# Patient Record
Sex: Male | Born: 1960
Health system: Southern US, Community
[De-identification: ages and names within clinical notes are randomized; demographics above are authoritative.]

## PROBLEM LIST (undated history)

## (undated) DIAGNOSIS — K219 Gastro-esophageal reflux disease without esophagitis: Secondary | ICD-10-CM

## (undated) DIAGNOSIS — J342 Deviated nasal septum: Secondary | ICD-10-CM

## (undated) DIAGNOSIS — H401410 Capsular glaucoma with pseudoexfoliation of lens, right eye, stage unspecified: Secondary | ICD-10-CM

## (undated) DIAGNOSIS — H7290 Unspecified perforation of tympanic membrane, unspecified ear: Secondary | ICD-10-CM

## (undated) HISTORY — PX: ESOPHAGOGASTRODUODENOSCOPY: SHX1529

## (undated) HISTORY — DX: Unspecified perforation of tympanic membrane, unspecified ear: H72.90

## (undated) HISTORY — DX: Deviated nasal septum: J34.2

## (undated) HISTORY — PX: FACIAL RECONSTRUCTION SURGERY: SHX631

## (undated) HISTORY — DX: Capsular glaucoma with pseudoexfoliation of lens, right eye, stage unspecified: H40.1410

## (undated) HISTORY — PX: MIDDLE EAR SURGERY: SHX713

---

## 2005-08-08 ENCOUNTER — Emergency Department (HOSPITAL_COMMUNITY): Admission: EM | Admit: 2005-08-08 | Discharge: 2005-08-08 | Payer: Self-pay | Admitting: Emergency Medicine

## 2006-10-13 HISTORY — PX: KNEE SURGERY: SHX244

## 2012-08-17 ENCOUNTER — Other Ambulatory Visit: Payer: Self-pay | Admitting: Orthopedic Surgery

## 2012-08-17 DIAGNOSIS — M25561 Pain in right knee: Secondary | ICD-10-CM

## 2012-08-21 ENCOUNTER — Ambulatory Visit
Admission: RE | Admit: 2012-08-21 | Discharge: 2012-08-21 | Disposition: A | Discharge status: Home / Self Care | Payer: BC Managed Care – PPO | Source: Ambulatory Visit | Attending: Orthopedic Surgery | Admitting: Orthopedic Surgery

## 2012-08-21 DIAGNOSIS — M25561 Pain in right knee: Secondary | ICD-10-CM

## 2014-10-13 HISTORY — PX: KNEE CARTILAGE SURGERY: SHX688

## 2015-10-14 HISTORY — PX: KNEE CARTILAGE SURGERY: SHX688

## 2015-10-31 ENCOUNTER — Other Ambulatory Visit (HOSPITAL_COMMUNITY): Payer: Self-pay | Admitting: Sports Medicine

## 2015-10-31 DIAGNOSIS — G8928 Other chronic postprocedural pain: Secondary | ICD-10-CM

## 2015-10-31 DIAGNOSIS — M2242 Chondromalacia patellae, left knee: Secondary | ICD-10-CM

## 2015-10-31 DIAGNOSIS — S83232S Complex tear of medial meniscus, current injury, left knee, sequela: Secondary | ICD-10-CM

## 2015-11-09 ENCOUNTER — Encounter (HOSPITAL_COMMUNITY)
Admission: RE | Admit: 2015-11-09 | Discharge: 2015-11-09 | Disposition: A | Discharge status: Home / Self Care | Payer: Worker's Compensation | Source: Ambulatory Visit | Attending: Sports Medicine | Admitting: Sports Medicine

## 2015-11-09 ENCOUNTER — Ambulatory Visit (HOSPITAL_COMMUNITY)
Admission: RE | Admit: 2015-11-09 | Discharge: 2015-11-09 | Disposition: A | Discharge status: Home / Self Care | Payer: Worker's Compensation | Source: Ambulatory Visit | Attending: Sports Medicine | Admitting: Sports Medicine

## 2015-11-09 DIAGNOSIS — M25562 Pain in left knee: Secondary | ICD-10-CM | POA: Insufficient documentation

## 2015-11-09 DIAGNOSIS — G8928 Other chronic postprocedural pain: Secondary | ICD-10-CM

## 2015-11-09 DIAGNOSIS — M2242 Chondromalacia patellae, left knee: Secondary | ICD-10-CM

## 2015-11-09 DIAGNOSIS — X58XXXS Exposure to other specified factors, sequela: Secondary | ICD-10-CM | POA: Insufficient documentation

## 2015-11-09 DIAGNOSIS — S83232S Complex tear of medial meniscus, current injury, left knee, sequela: Secondary | ICD-10-CM | POA: Insufficient documentation

## 2015-11-09 MED ORDER — TECHNETIUM TC 99M MEDRONATE IV KIT
25.0000 | PACK | Freq: Once | INTRAVENOUS | Status: AC | PRN
  Administered 2015-11-09: 25 via INTRAVENOUS

## 2016-01-29 ENCOUNTER — Encounter (HOSPITAL_COMMUNITY): Payer: Self-pay | Admitting: Physician Assistant

## 2016-01-29 ENCOUNTER — Emergency Department (HOSPITAL_COMMUNITY): Payer: BLUE CROSS/BLUE SHIELD

## 2016-01-29 ENCOUNTER — Observation Stay (HOSPITAL_COMMUNITY)
Admission: EM | Admit: 2016-01-29 | Discharge: 2016-01-30 | Disposition: A | Discharge status: Home / Self Care | Payer: BLUE CROSS/BLUE SHIELD | Attending: Interventional Cardiology | Admitting: Interventional Cardiology

## 2016-01-29 DIAGNOSIS — I2510 Atherosclerotic heart disease of native coronary artery without angina pectoris: Secondary | ICD-10-CM | POA: Diagnosis not present

## 2016-01-29 DIAGNOSIS — Z8249 Family history of ischemic heart disease and other diseases of the circulatory system: Secondary | ICD-10-CM | POA: Insufficient documentation

## 2016-01-29 DIAGNOSIS — E785 Hyperlipidemia, unspecified: Secondary | ICD-10-CM | POA: Diagnosis not present

## 2016-01-29 DIAGNOSIS — I4510 Unspecified right bundle-branch block: Secondary | ICD-10-CM | POA: Insufficient documentation

## 2016-01-29 DIAGNOSIS — F419 Anxiety disorder, unspecified: Secondary | ICD-10-CM | POA: Diagnosis not present

## 2016-01-29 DIAGNOSIS — R072 Precordial pain: Secondary | ICD-10-CM | POA: Diagnosis not present

## 2016-01-29 DIAGNOSIS — I25110 Atherosclerotic heart disease of native coronary artery with unstable angina pectoris: Secondary | ICD-10-CM | POA: Diagnosis not present

## 2016-01-29 DIAGNOSIS — R079 Chest pain, unspecified: Secondary | ICD-10-CM | POA: Diagnosis not present

## 2016-01-29 DIAGNOSIS — K219 Gastro-esophageal reflux disease without esophagitis: Secondary | ICD-10-CM | POA: Diagnosis present

## 2016-01-29 DIAGNOSIS — E669 Obesity, unspecified: Secondary | ICD-10-CM

## 2016-01-29 HISTORY — DX: Gastro-esophageal reflux disease without esophagitis: K21.9

## 2016-01-29 LAB — LIPID PANEL
CHOL/HDL RATIO: 5.9 ratio
Cholesterol: 223 mg/dL — ABNORMAL HIGH (ref 0–200)
HDL: 38 mg/dL — AB (ref >40)
LDL CALC: 156 mg/dL — AB (ref 0–99)
TRIGLYCERIDES: 147 mg/dL (ref <150)
VLDL: 29 mg/dL (ref 0–40)

## 2016-01-29 LAB — BASIC METABOLIC PANEL
ANION GAP: 11 (ref 5–15)
BUN: 10 mg/dL (ref 6–20)
CO2: 23 mmol/L (ref 22–32)
Calcium: 9.2 mg/dL (ref 8.9–10.3)
Chloride: 105 mmol/L (ref 101–111)
Creatinine, Ser: 0.91 mg/dL (ref 0.61–1.24)
GFR calc Af Amer: >60 mL/min (ref >60)
GFR calc non Af Amer: >60 mL/min (ref >60)
GLUCOSE: 120 mg/dL — AB (ref 65–99)
POTASSIUM: 4 mmol/L (ref 3.5–5.1)
Sodium: 139 mmol/L (ref 135–145)

## 2016-01-29 LAB — TSH: TSH: 1.499 u[IU]/mL (ref 0.350–4.500)

## 2016-01-29 LAB — I-STAT TROPONIN, ED: Troponin i, poc: 0 ng/mL (ref 0.00–0.08)

## 2016-01-29 LAB — CBC
HEMATOCRIT: 39.3 % (ref 39.0–52.0)
HEMOGLOBIN: 13.1 g/dL (ref 13.0–17.0)
MCH: 27.1 pg (ref 26.0–34.0)
MCHC: 33.3 g/dL (ref 30.0–36.0)
MCV: 81.4 fL (ref 78.0–100.0)
Platelets: 205 10*3/uL (ref 150–400)
RBC: 4.83 MIL/uL (ref 4.22–5.81)
RDW: 13.1 % (ref 11.5–15.5)
WBC: 3.5 10*3/uL — ABNORMAL LOW (ref 4.0–10.5)

## 2016-01-29 LAB — PROTIME-INR
INR: 0.97 (ref 0.00–1.49)
Prothrombin Time: 13.1 seconds (ref 11.6–15.2)

## 2016-01-29 LAB — TROPONIN I: Troponin I: <0.03 ng/mL (ref <0.031)

## 2016-01-29 LAB — APTT: APTT: 28 s (ref 24–37)

## 2016-01-29 MED ORDER — ASPIRIN EC 81 MG PO TBEC: 81.0000 mg | DELAYED_RELEASE_TABLET | Freq: Every day | ORAL | Status: DC

## 2016-01-29 MED ORDER — SODIUM CHLORIDE 0.9 % IV SOLN: 250.0000 mL | INTRAVENOUS | Status: DC | PRN

## 2016-01-29 MED ORDER — MORPHINE SULFATE (PF) 2 MG/ML IV SOLN
2.0000 mg | INTRAVENOUS | Status: DC | PRN
  Administered 2016-01-29 (×2): 2 mg via INTRAVENOUS
  Filled 2016-01-29 (×2): qty 1

## 2016-01-29 MED ORDER — NITROGLYCERIN 2 % TD OINT
1.0000 [in_us] | TOPICAL_OINTMENT | Freq: Three times a day (TID) | TRANSDERMAL | Status: DC
  Administered 2016-01-29: 1 [in_us] via TOPICAL
  Filled 2016-01-29 (×2): qty 1

## 2016-01-29 MED ORDER — ACETAMINOPHEN 325 MG PO TABS: 650.0000 mg | ORAL_TABLET | ORAL | Status: DC | PRN

## 2016-01-29 MED ORDER — ATORVASTATIN CALCIUM 40 MG PO TABS
40.0000 mg | ORAL_TABLET | Freq: Every day | ORAL | Status: DC
  Administered 2016-01-30: 40 mg via ORAL
  Filled 2016-01-29: qty 1

## 2016-01-29 MED ORDER — NITROGLYCERIN 0.4 MG SL SUBL: 0.4000 mg | SUBLINGUAL_TABLET | SUBLINGUAL | Status: DC | PRN

## 2016-01-29 MED ORDER — LORATADINE 10 MG PO TABS
10.0000 mg | ORAL_TABLET | Freq: Every day | ORAL | Status: DC
  Administered 2016-01-29: 10 mg via ORAL
  Filled 2016-01-29: qty 1

## 2016-01-29 MED ORDER — SODIUM CHLORIDE 0.9% FLUSH
3.0000 mL | Freq: Two times a day (BID) | INTRAVENOUS | Status: DC
Start: 2016-01-29 — End: 2016-01-30
  Administered 2016-01-30: 3 mL via INTRAVENOUS

## 2016-01-29 MED ORDER — ONDANSETRON HCL 4 MG/2ML IJ SOLN: 4.0000 mg | Freq: Four times a day (QID) | INTRAMUSCULAR | Status: DC | PRN

## 2016-01-29 MED ORDER — SODIUM CHLORIDE 0.9 % WEIGHT BASED INFUSION
3.0000 mL/kg/h | INTRAVENOUS | Status: DC
  Administered 2016-01-30: 3 mL/kg/h via INTRAVENOUS

## 2016-01-29 MED ORDER — HEPARIN (PORCINE) IN NACL 100-0.45 UNIT/ML-% IJ SOLN
1300.0000 [IU]/h | INTRAMUSCULAR | Status: DC
Start: 2016-01-29 — End: 2016-01-30
  Administered 2016-01-29 – 2016-01-30 (×2): 1300 [IU]/h via INTRAVENOUS
  Filled 2016-01-29 (×2): qty 250

## 2016-01-29 MED ORDER — SODIUM CHLORIDE 0.9% FLUSH: 3.0000 mL | INTRAVENOUS | Status: DC | PRN

## 2016-01-29 MED ORDER — SODIUM CHLORIDE 0.9 % WEIGHT BASED INFUSION
1.0000 mL/kg/h | INTRAVENOUS | Status: DC
  Administered 2016-01-30: 1 mL/kg/h via INTRAVENOUS

## 2016-01-29 MED ORDER — TRAZODONE HCL 50 MG PO TABS
25.0000 mg | ORAL_TABLET | Freq: Every evening | ORAL | Status: DC | PRN
  Administered 2016-01-30: 25 mg via ORAL
  Filled 2016-01-29: qty 1

## 2016-01-29 MED ORDER — ALPRAZOLAM 0.25 MG PO TABS
0.2500 mg | ORAL_TABLET | Freq: Two times a day (BID) | ORAL | Status: DC | PRN
  Administered 2016-01-30: 0.25 mg via ORAL
  Filled 2016-01-29: qty 1

## 2016-01-29 MED ORDER — CARVEDILOL 3.125 MG PO TABS
3.1250 mg | ORAL_TABLET | Freq: Two times a day (BID) | ORAL | Status: DC
  Administered 2016-01-30 (×2): 3.125 mg via ORAL
  Filled 2016-01-29 (×2): qty 1

## 2016-01-29 MED ORDER — ASPIRIN 81 MG PO CHEW
324.0000 mg | CHEWABLE_TABLET | ORAL | Status: AC
  Administered 2016-01-30: 324 mg via ORAL
  Filled 2016-01-29: qty 4

## 2016-01-29 MED ORDER — CITALOPRAM HYDROBROMIDE 20 MG PO TABS
20.0000 mg | ORAL_TABLET | Freq: Every day | ORAL | Status: DC
  Administered 2016-01-29 – 2016-01-30 (×2): 20 mg via ORAL
  Filled 2016-01-29 (×2): qty 1

## 2016-01-29 MED ORDER — SODIUM CHLORIDE 0.9% FLUSH: 3.0000 mL | Freq: Two times a day (BID) | INTRAVENOUS | Status: DC

## 2016-01-29 MED ORDER — PANTOPRAZOLE SODIUM 40 MG PO TBEC: 40.0000 mg | DELAYED_RELEASE_TABLET | Freq: Every day | ORAL | Status: DC

## 2016-01-29 MED ORDER — TRAMADOL HCL 50 MG PO TABS: 25.0000 mg | ORAL_TABLET | Freq: Four times a day (QID) | ORAL | Status: DC | PRN

## 2016-01-29 MED ORDER — PANTOPRAZOLE SODIUM 40 MG PO TBEC
40.0000 mg | DELAYED_RELEASE_TABLET | Freq: Two times a day (BID) | ORAL | Status: DC
  Administered 2016-01-30 (×2): 40 mg via ORAL
  Filled 2016-01-29 (×3): qty 1

## 2016-01-29 MED ORDER — HEPARIN BOLUS VIA INFUSION
4000.0000 [IU] | Freq: Once | INTRAVENOUS | Status: AC
  Administered 2016-01-29: 4000 [IU] via INTRAVENOUS
  Filled 2016-01-29: qty 4000

## 2016-01-29 NOTE — ED Notes (Signed)
Patient transported to X-ray 

## 2016-01-29 NOTE — H&P (Addendum)
CARDIOLOGY HISTORY AND PHYSICAL   Patient ID: Jose Mason MRN: [id number] DOB/AGE: 54/05/1961 54 y.o.  Admit date: 01/29/2016  Primary Physician   Dr Marina GoodellPerry Primary Cardiologist   New, Dr Katrinka BlazingSmith Reason for Consultation   Chest pain   JXB:[id number]:Jose Mason a 55 y.o. year old male with a history of GERD, no hx HTN, DM. Lipids were high remotely, improved after weight loss. Pt has gained weight back due to inactivity, no recent check.  He has a long history of GERD, usually a pressure in the center of his chest, generally controlled by am Prilosec, but sometimes he has to take 2.   Pt woke at 2 am Sunday with chest pain. Ate 4 baby ASA and drank water. Pain was severe "someone sitting on me", 9/10. No SOB, N&V, diaphoresis. Eventually the pain eased off to a 4/10, pt able to sleep. Pain remained 4/10. Sunday pm, pt took Prilosec, sx improved to a 1/10.  On Monday, pt had minor discomfort all day. No change in pain with ambulation. Pt did not seek help because he thought it was from acid reflux, but did not eat his usual trigger foods.   Today, the pain was 5/10 and he saw his primary MD. He was given a GI cocktail which helped the pain. He was still having some discomfort so he was given a SL NTG and his pain was temporarily relieved. He was sent by EMS to the ER. En route, he was given 3 more SL NTG, with help in his pain. The pain Mason currently a 3-4/10, he has a HA from the NTG. The pain feels to him like it Mason in his chest wall, his reflux Mason different. The GERD sx are deeper and in the center of his chest.   Since his last knee surgery, he has been working hard in rehab, has had no chest pain related to the therapy sessions.  Past Medical History  Diagnosis Date  . GERD (gastroesophageal reflux disease)     Past Surgical History  Procedure Laterality Date  . Esophagogastroduodenoscopy    . Knee cartilage surgery  2016  . Knee cartilage surgery  2017  . Middle ear  surgery  1990s    eardrum reconstruction x 2 after childhood infections   Allergies  Allergen Reactions  . Ambien [Zolpidem]   . Lexapro [Escitalopram Oxalate]    I have reviewed the patient's current medications Prior to Admission medications   Medication Sig Start Date End Date Taking? Authorizing Provider  omeprazole (PRILOSEC) 20 MG capsule Take 20 mg by mouth daily.   Yes Historical Provider, MD     Social History   Social History  . Marital Status: Married    Spouse Name: N/A  . Number of Children: N/A  . Years of Education: N/A   Occupational History  . Unemployed    Social History Main Topics  . Smoking status: Never Smoker   . Smokeless tobacco: Never Used  . Alcohol Use: 0.0 oz/week    0 Standard drinks or equivalent per week     Comment: rare beer  . Drug Use: No  . Sexual Activity: Not on file   Other Topics Concern  . Not on file   Social History Narrative   [place] in [place] with his wife.    Family Status  Relation Status Death Age  . Mother Deceased [phone]  . Father Deceased [phone]  . Sister Alive  Family History  Problem Relation Age of Onset  . Coronary artery disease Sister 15  . Cancer Mother     Brain  . Heart attack Father   . Cancer Sister     Breast     ROS:  Full 14 point review of systems complete and found to be negative unless listed above.  Physical Exam: Blood pressure 126/77, pulse 73, temperature 97.5 F (36.4 C), temperature source Oral, resp. rate 18, height  (1.778 m), weight 216 lb (97.977 kg), SpO2 97 %.  General: Well developed, well nourished, male in no acute distress Head: Eyes PERRLA, No xanthomas.   Normocephalic and atraumatic, oropharynx without edema or exudate. Dentition: fair Lungs: clear bilaterally Heart: HRRR S1 S2, no rub/gallop, no murmur. pulses are 2+ all 4 extrem.   Neck: No carotid bruits. No lymphadenopathy.  JVD not elevated Abdomen: Bowel sounds present, abdomen soft and non-tender without  masses or hernias noted. Msk:  No spine or cva tenderness. No weakness, no joint deformities or effusions. Extremities: No clubbing or cyanosis. No edema.  Neuro: Alert and oriented X 3. No focal deficits noted. Psych:  Good affect, responds appropriately Skin: No rashes or lesions noted.  Labs:   Lab Results  Component Value Date   WBC 3.5* 01/29/2016   HGB 13.1 01/29/2016   HCT 39.3 01/29/2016   MCV 81.4 01/29/2016   PLT 205 01/29/2016     Recent Labs Lab 01/29/16 1452  NA 139  K 4.0  CL 105  CO2 23  BUN 10  CREATININE 0.91  CALCIUM 9.2  GLUCOSE 120*    Recent Labs  01/29/16 1457  TROPIPOC 0.00   ECG: 01/29/2016 SR, RBBB, no old for comparison   Radiology:  Dg Chest 2 View 01/29/2016  CLINICAL DATA:  Chest pain for 2 days EXAM: CHEST  2 VIEW COMPARISON:  None. FINDINGS: Lungs are clear. Heart size and pulmonary vascularity are normal. No adenopathy. No pneumothorax. No bone lesions. IMPRESSION: No edema or consolidation. Electronically Signed   By: Bretta Bang III M.D.   On: 01/29/2016 15:08    ASSESSMENT AND PLAN:   The patient was seen today by Dr Katrinka Blazing, the patient evaluated and the data reviewed.  Principal Problem:  1.  Precordial chest pain - initial ez negative > 48 hr after onset of pain - however, sx helped by SL NTG as well as GI cocktail - pt says sx feel more like his chest wall, are not like his GERD - no history of exertional chest pain, but activity this am made it worse. - MD advise on r/o admit and MV vs d/c home and OP MV  Active Problems:   GERD (gastroesophageal reflux disease) - will write for prescription strength PPI   Signed: Leanna Battles 01/29/2016 4:51 PM Beeper [phone]  Co-Sign MD The patient has been seen in conjunction with Theodore Demark, PA-C. All aspects of care have been considered and discussed. The patient has been personally interviewed, examined, and all clinical data has been reviewed.   He has  had at least a 2 day history of waxing and waning chest pressure. During this time the discomfort has become very minimal as it had by this morning. Upon getting up and moving around the discomfort becomes more intense. The discomfort Mason dissimilar to prior "indigestion".  Exam Mason unremarkable other than obesity. No rub Mason heard.  ECG reveals right bundle branch block with nonspecific T-wave abnormality.  Overall, the story has  atypical features but given the strong family history, obesity, glucose intolerance, hyperlipidemia, age, and male sex along with right bundle branch block on EKG, I feel that definitive investigation with coronary angiography Mason indicated. I am concerned that he may have well-formed collaterals that are presenting identification of the ischemic territory and preventing enzyme release. The patient was counseled to undergo left heart catheterization, coronary angiography, and possible percutaneous coronary intervention with stent implantation. The procedural risks and benefits were discussed in detail. The risks discussed included death, stroke, myocardial infarction, life-threatening bleeding, limb ischemia, kidney injury, allergy, and possible emergency cardiac surgery. The risk of these significant complications were estimated to occur less than 1% of the time. After discussion, the patient has agreed to proceed. IV heparin, serial markers, long-acting nitrates, and remain in hospital until definitive procedures performed tomorrow.

## 2016-01-29 NOTE — ED Notes (Signed)
Attempted report x2. Previous RN attempted as well.

## 2016-01-29 NOTE — Progress Notes (Signed)
ANTICOAGULATION CONSULT NOTE - Initial Consult  Pharmacy Consult for Heparin Indication: chest pain/ACS  Allergies  Allergen Reactions  . Ambien [Zolpidem]     Abnormal feeling  . Lexapro [Escitalopram Oxalate]     Patient Measurements: Height: 5\' 10"  (177.8 cm) Weight: 216 lb (97.977 kg) IBW/kg (Calculated) : 73 Heparin Dosing Weight: 93.3 kg  Vital Signs: Temp: 97.5 F (36.4 C) (04/18 1442) Temp Source: Oral (04/18 1442) BP: 134/88 mmHg (04/18 1800) Pulse Rate: 65 (04/18 1800)  Labs:  Recent Labs  01/29/16 1452  HGB 13.1  HCT 39.3  PLT 205  CREATININE 0.91    Estimated Creatinine Clearance: 110.2 mL/min (by C-G formula based on Cr of 0.91).   Medical History: Past Medical History  Diagnosis Date  . GERD (gastroesophageal reflux disease)     Assessment: 54yo-Male presents with chest pain for several days.  PMH includes GERD only.  Pharmacy consulted to start heparin in the setting of ACS/Chest Pain.  On Admit: CBC wnl, Trop 0.00, SCr 0.91  Goal of Therapy:  Heparin level 0.3-0.7 units/ml Monitor platelets by anticoagulation protocol: Yes   Plan:  Give 4000 units bolus x 1 Start heparin infusion at 1300 units/hr Check anti-Xa level in 6 hours and daily while on heparin Continue to monitor H&H and platelets  Kathlynn GrateAdam Daisy Mason 01/29/2016,6:30 PM

## 2016-01-29 NOTE — ED Notes (Signed)
REMS from PCP, CP onset Sunday, stabbing, no radiation, had 1 nitro and GI cocktail, 1/10 on arrival, 2 nitro from EMS, 324 ASA 20g left hand, VSS, A/O X4

## 2016-01-29 NOTE — ED Provider Notes (Signed)
CSN: [id number]     Arrival date & time 01/29/16  1435 History   First MD Initiated Contact with Patient 01/29/16 1450     Chief Complaint  Patient presents with  . Chest Pain     (Consider location/radiation/quality/duration/timing/severity/associated sxs/prior Treatment) HPI   Patient is a 55 year old male with past medical history of reflux who presents the ED with complaint of chest pain. Patient reports waking up at 2 AM Sunday morning due to constant intense burning pressure to his midsternal chest wall, denies radiation, denies any aggravating or alleviating factors. He reports his pain feels different than his typical reflux pain. Patient reports he took 4 aspirin at home Monday morning, fell back asleep and when he woke up this chest pain had resolved spontaneously. He notes this morning when he woke the CP returned. Denies fever, chills, HA, lightheadedness, dizziness, diaphoresis, cough, SOB, wheezing, abdominal pain, N/V/D, numbness, tingling, weakness. Pt reports he went to his PCP's office PTA, was given GI cocktail and 1 SL nitro. Pt reports relief of CP for appx. 10 minutes before it returned. Pt was then transported to the ED via EMS and given ASA and a total of 5 SL nitro PTA. Pt reports having appx. 10 minutes of resolution of CP following each nitro. Denies any prior cardiac hx of stress testing.    Past Medical History  Diagnosis Date  . GERD (gastroesophageal reflux disease)    Past Surgical History  Procedure Laterality Date  . Esophagogastroduodenoscopy    . Knee cartilage surgery  2016  . Knee cartilage surgery  2017  . Middle ear surgery  1990s    eardrum reconstruction x 2 after childhood infections   Family History  Problem Relation Age of Onset  . Coronary artery disease Sister [id number]  . Cancer Mother     Brain  . Heart attack Father   . Cancer Sister     Breast   Social History  Substance Use Topics  . Smoking status: Never Smoker   . Smokeless tobacco:  Never Used  . Alcohol Use: 0.0 oz/week    0 Standard drinks or equivalent per week     Comment: rare beer    Review of Systems  Cardiovascular: Positive for chest pain.  All other systems reviewed and are negative.     Allergies  Ambien and Lexapro  Home Medications   Prior to Admission medications   Medication Sig Start Date End Date Taking? Authorizing Provider  citalopram (CELEXA) 20 MG tablet Take 20 mg by mouth daily.   Yes Historical Provider, MD  omeprazole (PRILOSEC) 20 MG capsule Take 20 mg by mouth daily.   Yes Historical Provider, MD  traMADol (ULTRAM) 50 MG tablet Take 25 mg by mouth every 6 (six) hours as needed.   Yes Historical Provider, MD  traZODone (DESYREL) 50 MG tablet Take 25 mg by mouth at bedtime as needed for sleep.   Yes Historical Provider, MD   BP 126/77 mmHg  Pulse 73  Temp(Src) 97.5 F (36.4 C) (Oral)  Resp 18  Ht 5\' 10"  (1.778 m)  Wt 97.977 kg  BMI 30.99 kg/m2  SpO2 97% Physical Exam  Constitutional: He is oriented to person, place, and time. He appears well-developed and well-nourished. No distress.  HENT:  Head: Normocephalic and atraumatic.  Mouth/Throat: Oropharynx is clear and moist. No oropharyngeal exudate.  Eyes: Conjunctivae and EOM are normal. Pupils are equal, round, and reactive to light. Right eye exhibits no discharge. Left eye exhibits  no discharge. No scleral icterus.  Neck: Normal range of motion. Neck supple.  Cardiovascular: Normal rate, regular rhythm, normal heart sounds and intact distal pulses.   Pulmonary/Chest: Effort normal and breath sounds normal.  Abdominal: Soft. Bowel sounds are normal. He exhibits no distension and no mass. There is no tenderness. There is no rebound and no guarding.  Musculoskeletal: He exhibits no edema.  Lymphadenopathy:    He has no cervical adenopathy.  Neurological: He is alert and oriented to person, place, and time.  Skin: Skin is warm and dry. He is not diaphoretic.  Nursing note  and vitals reviewed.   ED Course  Procedures (including critical care time) Labs Review Labs Reviewed  BASIC METABOLIC PANEL - Abnormal; Notable for the following:    Glucose, Bld 120 (*)    All other components within normal limits  CBC - Abnormal; Notable for the following:    WBC 3.5 (*)    All other components within normal limits  I-STAT TROPOININ, ED    Imaging Review Dg Chest 2 View  01/29/2016  CLINICAL DATA:  Chest pain for 2 days EXAM: CHEST  2 VIEW COMPARISON:  None. FINDINGS: Lungs are clear. Heart size and pulmonary vascularity are normal. No adenopathy. No pneumothorax. No bone lesions. IMPRESSION: No edema or consolidation. Electronically Signed   By: Bretta Bang III M.D.   On: 01/29/2016 15:08   I have personally reviewed and evaluated these images and lab results as part of my medical decision-making.   EKG Interpretation   Date/Time:  Tuesday January 29 2016 14:41:19 EDT Ventricular Rate:  72 PR Interval:  125 QRS Duration: 121 QT Interval:  426 QTC Calculation: 466 R Axis:   -16 Text Interpretation:  Sinus rhythm Right bundle branch block Confirmed by  ZAMMIT  MD, JOSEPH [phone]) on 01/29/2016 3:54:05 PM      MDM   Final diagnoses:  Gastroesophageal reflux disease without esophagitis    Patient presents with midsternal chest pain that has been present for the past 2 days. Patient endorses intermittent resolution of symptoms after taking sublingual nitroglycerin prior to arrival. He notes pain is different than his typical pain associated with reflux. Denies history of cardiac disease. VSS. Exam unremarkable. EKG showed sinus rhythm with right bundle branch block. Troponin negative. Chest x-ray negative. Consulted cardiology who will come evaluate the pt in the ED. Cardiology recommends defenitive investigation with coronary angiography. Plan to admit patient until definitive procedures are performed tomorrow. Discussed results and plan for admission  with patient.    Satira Sark [place], [place] 01/29/16 1830  Bethann Berkshire, MD 01/30/16 1314

## 2016-01-30 ENCOUNTER — Other Ambulatory Visit: Payer: Self-pay | Admitting: Cardiology

## 2016-01-30 ENCOUNTER — Encounter (HOSPITAL_COMMUNITY)
Admission: EM | Disposition: A | Discharge status: Home / Self Care | Payer: Self-pay | Source: Home / Self Care | Attending: Emergency Medicine

## 2016-01-30 DIAGNOSIS — E785 Hyperlipidemia, unspecified: Secondary | ICD-10-CM | POA: Diagnosis not present

## 2016-01-30 DIAGNOSIS — R079 Chest pain, unspecified: Secondary | ICD-10-CM

## 2016-01-30 DIAGNOSIS — I2510 Atherosclerotic heart disease of native coronary artery without angina pectoris: Secondary | ICD-10-CM

## 2016-01-30 DIAGNOSIS — Z8249 Family history of ischemic heart disease and other diseases of the circulatory system: Secondary | ICD-10-CM | POA: Diagnosis not present

## 2016-01-30 DIAGNOSIS — I4510 Unspecified right bundle-branch block: Secondary | ICD-10-CM | POA: Diagnosis present

## 2016-01-30 DIAGNOSIS — I25110 Atherosclerotic heart disease of native coronary artery with unstable angina pectoris: Secondary | ICD-10-CM | POA: Diagnosis not present

## 2016-01-30 DIAGNOSIS — F419 Anxiety disorder, unspecified: Secondary | ICD-10-CM | POA: Diagnosis present

## 2016-01-30 HISTORY — PX: CARDIAC CATHETERIZATION: SHX172

## 2016-01-30 LAB — HEPARIN LEVEL (UNFRACTIONATED)
HEPARIN UNFRACTIONATED: 0.46 [IU]/mL (ref 0.30–0.70)
HEPARIN UNFRACTIONATED: 0.37 [IU]/mL (ref 0.30–0.70)

## 2016-01-30 LAB — CBC
HEMATOCRIT: 37.8 % — AB (ref 39.0–52.0)
HEMOGLOBIN: 12.9 g/dL — AB (ref 13.0–17.0)
MCH: 27.6 pg (ref 26.0–34.0)
MCHC: 34.1 g/dL (ref 30.0–36.0)
MCV: 80.8 fL (ref 78.0–100.0)
Platelets: 210 10*3/uL (ref 150–400)
RBC: 4.68 MIL/uL (ref 4.22–5.81)
RDW: 13.1 % (ref 11.5–15.5)
WBC: 4.4 10*3/uL (ref 4.0–10.5)

## 2016-01-30 LAB — COMPREHENSIVE METABOLIC PANEL
ALBUMIN: 3.7 g/dL (ref 3.5–5.0)
ALK PHOS: 70 U/L (ref 38–126)
ALT: 58 U/L (ref 17–63)
AST: 34 U/L (ref 15–41)
Anion gap: 9 (ref 5–15)
BILIRUBIN TOTAL: 0.3 mg/dL (ref 0.3–1.2)
BUN: 9 mg/dL (ref 6–20)
CALCIUM: 9 mg/dL (ref 8.9–10.3)
CO2: 25 mmol/L (ref 22–32)
CREATININE: 0.96 mg/dL (ref 0.61–1.24)
Chloride: 105 mmol/L (ref 101–111)
GFR calc Af Amer: >60 mL/min (ref >60)
GFR calc non Af Amer: >60 mL/min (ref >60)
GLUCOSE: 118 mg/dL — AB (ref 65–99)
Potassium: 3.7 mmol/L (ref 3.5–5.1)
SODIUM: 139 mmol/L (ref 135–145)
TOTAL PROTEIN: 6.3 g/dL — AB (ref 6.5–8.1)

## 2016-01-30 LAB — HEMOGLOBIN A1C
HEMOGLOBIN A1C: 5.8 % — AB (ref 4.8–5.6)
MEAN PLASMA GLUCOSE: 120 mg/dL

## 2016-01-30 LAB — LIPID PANEL
Cholesterol: 205 mg/dL — ABNORMAL HIGH (ref 0–200)
HDL: 34 mg/dL — ABNORMAL LOW (ref >40)
LDL CALC: 136 mg/dL — AB (ref 0–99)
Total CHOL/HDL Ratio: 6 RATIO
Triglycerides: 177 mg/dL — ABNORMAL HIGH (ref <150)
VLDL: 35 mg/dL (ref 0–40)

## 2016-01-30 LAB — TROPONIN I

## 2016-01-30 SURGERY — LEFT HEART CATH AND CORONARY ANGIOGRAPHY
Anesthesia: LOCAL

## 2016-01-30 MED ORDER — NITROGLYCERIN 2 % TD OINT
1.0000 [in_us] | TOPICAL_OINTMENT | Freq: Three times a day (TID) | TRANSDERMAL | Status: DC
  Filled 2016-01-30: qty 1

## 2016-01-30 MED ORDER — HEPARIN (PORCINE) IN NACL 2-0.9 UNIT/ML-% IJ SOLN
INTRAMUSCULAR | Status: AC
  Filled 2016-01-30: qty 1000

## 2016-01-30 MED ORDER — ASPIRIN 81 MG PO TBEC
81.0000 mg | DELAYED_RELEASE_TABLET | Freq: Every day | ORAL | Status: DC
Start: 2016-01-30 — End: 2017-07-27

## 2016-01-30 MED ORDER — MIDAZOLAM HCL 2 MG/2ML IJ SOLN
INTRAMUSCULAR | Status: DC | PRN
  Administered 2016-01-30: 2 mg via INTRAVENOUS

## 2016-01-30 MED ORDER — NITROGLYCERIN 1 MG/10 ML FOR IR/CATH LAB
INTRA_ARTERIAL | Status: AC
  Filled 2016-01-30: qty 10

## 2016-01-30 MED ORDER — VERAPAMIL HCL 2.5 MG/ML IV SOLN
INTRAVENOUS | Status: DC | PRN
  Administered 2016-01-30: 10 mL via INTRA_ARTERIAL

## 2016-01-30 MED ORDER — SODIUM CHLORIDE 0.9% FLUSH: 3.0000 mL | INTRAVENOUS | Status: DC | PRN

## 2016-01-30 MED ORDER — SODIUM CHLORIDE 0.9 % WEIGHT BASED INFUSION: 1.0000 mL/kg/h | INTRAVENOUS | Status: AC

## 2016-01-30 MED ORDER — PANTOPRAZOLE SODIUM 40 MG PO TBEC: 40.0000 mg | DELAYED_RELEASE_TABLET | Freq: Two times a day (BID) | ORAL | Status: AC

## 2016-01-30 MED ORDER — ATORVASTATIN CALCIUM 40 MG PO TABS: 40.0000 mg | ORAL_TABLET | Freq: Every day | ORAL | Status: DC

## 2016-01-30 MED ORDER — LIDOCAINE HCL (PF) 1 % IJ SOLN
INTRAMUSCULAR | Status: DC | PRN
  Administered 2016-01-30: 2 mL via INTRADERMAL

## 2016-01-30 MED ORDER — FENTANYL CITRATE (PF) 100 MCG/2ML IJ SOLN
INTRAMUSCULAR | Status: AC
  Filled 2016-01-30: qty 2

## 2016-01-30 MED ORDER — MIDAZOLAM HCL 2 MG/2ML IJ SOLN
INTRAMUSCULAR | Status: AC
  Filled 2016-01-30: qty 2

## 2016-01-30 MED ORDER — CARVEDILOL 3.125 MG PO TABS: 3.1250 mg | ORAL_TABLET | Freq: Two times a day (BID) | ORAL | Status: DC

## 2016-01-30 MED ORDER — HEPARIN SODIUM (PORCINE) 1000 UNIT/ML IJ SOLN
INTRAMUSCULAR | Status: DC | PRN
  Administered 2016-01-30: 5000 [IU] via INTRAVENOUS

## 2016-01-30 MED ORDER — DIAZEPAM 5 MG PO TABS
5.0000 mg | ORAL_TABLET | Freq: Four times a day (QID) | ORAL | Status: DC | PRN
  Administered 2016-01-30: 5 mg via ORAL
  Filled 2016-01-30: qty 1

## 2016-01-30 MED ORDER — SODIUM CHLORIDE 0.9 % IV SOLN: 250.0000 mL | INTRAVENOUS | Status: DC | PRN

## 2016-01-30 MED ORDER — TRAMADOL HCL 50 MG PO TABS: 50.0000 mg | ORAL_TABLET | Freq: Four times a day (QID) | ORAL | Status: DC | PRN

## 2016-01-30 MED ORDER — LIDOCAINE HCL (PF) 1 % IJ SOLN
INTRAMUSCULAR | Status: AC
  Filled 2016-01-30: qty 30

## 2016-01-30 MED ORDER — HEPARIN (PORCINE) IN NACL 2-0.9 UNIT/ML-% IJ SOLN
INTRAMUSCULAR | Status: DC | PRN
  Administered 2016-01-30: 1000 mL

## 2016-01-30 MED ORDER — FENTANYL CITRATE (PF) 100 MCG/2ML IJ SOLN
INTRAMUSCULAR | Status: DC | PRN
  Administered 2016-01-30: 25 ug via INTRAVENOUS

## 2016-01-30 MED ORDER — KETOROLAC TROMETHAMINE 30 MG/ML IJ SOLN: 30.0000 mg | Freq: Once | INTRAMUSCULAR | Status: DC

## 2016-01-30 MED ORDER — NITROGLYCERIN 0.4 MG SL SUBL: 0.4000 mg | SUBLINGUAL_TABLET | SUBLINGUAL | Status: DC | PRN

## 2016-01-30 MED ORDER — SODIUM CHLORIDE 0.9% FLUSH: 3.0000 mL | Freq: Two times a day (BID) | INTRAVENOUS | Status: DC

## 2016-01-30 SURGICAL SUPPLY — 10 items
CATH INFINITI 5FR ANG PIGTAIL (CATHETERS) ×2 IMPLANT
CATH OPTITORQUE JACKY 4.0 5F (CATHETERS) ×2 IMPLANT
DEVICE RAD COMP TR BAND LRG (VASCULAR PRODUCTS) ×2 IMPLANT
GLIDESHEATH SLEND SS 6F .021 (SHEATH) ×2 IMPLANT
KIT HEART LEFT (KITS) ×2 IMPLANT
PACK CARDIAC CATHETERIZATION (CUSTOM PROCEDURE TRAY) ×2 IMPLANT
SYR MEDRAD MARK V 150ML (SYRINGE) ×2 IMPLANT
TRANSDUCER W/STOPCOCK (MISCELLANEOUS) ×2 IMPLANT
TUBING CIL FLEX 10 FLL-RA (TUBING) ×2 IMPLANT
WIRE SAFE-T 1.5MM-J .035X260CM (WIRE) ×2 IMPLANT

## 2016-01-30 NOTE — Progress Notes (Signed)
[facility] using SBAR format, reviewed orders, labs, VS, meds and patinet's general condition, awaiting his arrival to room 24.

## 2016-01-30 NOTE — Discharge Summary (Signed)
Discharge Summary    Patient ID: Jose Mason,  MRN: [id number], DOB/AGE: [birth year] 3 y.o.  Admit date: 01/29/2016 Discharge date: 01/30/2016  Primary Care Provider: No primary care provider on file. Primary Cardiologist: None  Discharge Diagnoses    Principal Problem:   Chest pain with moderate risk for cardiac etiology Active Problems:   GERD (gastroesophageal reflux disease)   Family history of coronary artery disease   Dyslipidemia   Anxiety   RBBB   CAD- mild at cath 01/30/16   Allergies Allergies  Allergen Reactions  . Ambien [Zolpidem]     Abnormal feeling  . Lexapro [Escitalopram Oxalate]     Diagnostic Studies/Procedures  Left Heart Cath and Coronary Angiography  Prox RCA to Mid RCA lesion, 10% stenosed.  Ost 1st Mrg to 1st Mrg lesion, 50% stenosed.  Mid Cx lesion, 30% stenosed.  The left ventricular systolic function is normal.  1. No evidence of obstructive coronary artery disease. There is mild disease affecting the left circumflex and 50% ostial stenosis a relatively small OM1.   2. Normal LV systolic function and normal left ventricular end-diastolic pressure. Mild gradient across the LVOT/aortic valve (10 mm at most).  Recommendations: Aggressive medical therapy of risk factors. No revascularization is needed. Recommend an echocardiogram to evaluate the mild gradient noted above. This can be done as an outpatient. The patient can be discharged home.  _____________   History of Present Illness   [name] is a 55 year old male with a past medical history of GERD.  He presented to the ED on 01/29/16 with complaints of chest pain that woke him up out sleep.  The chest pain also occurred the day before as well.  He went to his PCP's office who advised him to go to the ED.   Upon arrival to the ED, his pain was relieved by SL Nitro.  His pain was severe pressure, rated at 9/10.  No associated nausea, vomiting or diaphoresis.Troponin  negative x3.  He has a strong family history of CAD and risk factors including obesity, glucose intolerance and HLD.  Plan was for left heart cath.   Hospital Course  Left heart cath on 01/30/16 (report above).  There was no obstructive CAD. Medical therapy and aggressive medical therapy for risk factors was reccommended. Right radial site is stable.   His LDL is 136. He was started on moderate dose statin. He will need to continue this for risk reduction.  A1C is 5.8.   He will need an echocardiogram outpatient according to cath notes. He was seen by Dr. Katrinka Blazing and deemed suitable for discharge.    _____________  Discharge Vitals Blood pressure 136/79, pulse 78, temperature 98 F (36.7 C), temperature source Oral, resp. rate 17, height  (1.778 m), weight 210 lb 14.4 oz (95.664 kg), SpO2 100 %.  Filed Weights   01/29/16 1442 01/29/16 2122 01/30/16 0707  Weight: 216 lb (97.977 kg) 211 lb 11.2 oz (96.026 kg) 210 lb 14.4 oz (95.664 kg)    Labs & Radiologic Studies     CBC  Recent Labs  01/29/16 1452 01/30/16 0128  WBC 3.5* 4.4  HGB 13.1 12.9*  HCT 39.3 37.8*  MCV 81.4 80.8  PLT 205 210   Basic Metabolic Panel  Recent Labs  01/29/16 1452 01/30/16 0128  NA 139 139  K 4.0 3.7  CL 105 105  CO2 23 25  GLUCOSE 120* 118*  BUN 10 9  CREATININE 0.91 0.96  CALCIUM 9.2 9.0   Liver Function Tests  Recent Labs  01/30/16 0128  AST 34  ALT 58  ALKPHOS 70  BILITOT 0.3  PROT 6.3*  ALBUMIN 3.7   Cardiac Enzymes  Recent Labs  01/29/16 1940 01/30/16 0128 01/30/16 0607  TROPONINI <0.03 <0.03 <0.03   Hemoglobin A1C  Recent Labs  01/29/16 1817  HGBA1C 5.8*   Fasting Lipid Panel  Recent Labs  01/30/16 0128  CHOL 205*  HDL 34*  LDLCALC 136*  TRIG 177*  CHOLHDL 6.0   Thyroid Function Tests  Recent Labs  01/29/16 1939  TSH 1.499    Dg Chest 2 View  01/29/2016  CLINICAL DATA:  Chest pain for 2 days EXAM: CHEST  2 VIEW COMPARISON:  None.  FINDINGS: Lungs are clear. Heart size and pulmonary vascularity are normal. No adenopathy. No pneumothorax. No bone lesions. IMPRESSION: No edema or consolidation. Electronically Signed   By: Bretta Bang III M.D.   On: 01/29/2016 15:08    Disposition   Pt is being discharged home today in good condition.  Follow-up Plans & Appointments     Discharge Instructions    Diet - low sodium heart healthy    Complete by:  As directed      Discharge instructions    Complete by:  As directed   NO HEAVY LIFTING OR SEXUAL ACTIVITY for 7 DAYS. NO DRIVING for 3-5 DAYS. NO SOAKING BATHS, HOT TUBS, POOLS, ETC., for 7 DAYS     Increase activity slowly    Complete by:  As directed            Discharge Medications   Current Discharge Medication List    START taking these medications   Details  aspirin EC 81 MG EC tablet Take 1 tablet (81 mg total) by mouth daily. Qty: 30 tablet    atorvastatin (LIPITOR) 40 MG tablet Take 1 tablet (40 mg total) by mouth daily at 6 PM. Qty: 30 tablet, Refills: 6    carvedilol (COREG) 3.125 MG tablet Take 1 tablet (3.125 mg total) by mouth 2 (two) times daily with a meal. Qty: 60 tablet, Refills: 12    nitroGLYCERIN (NITROSTAT) 0.4 MG SL tablet Place 1 tablet (0.4 mg total) under the tongue every 5 (five) minutes x 3 doses as needed for chest pain. Qty: 25 tablet, Refills: 2    pantoprazole (PROTONIX) 40 MG tablet Take 1 tablet (40 mg total) by mouth 2 (two) times daily before a meal. Qty: 60 tablet, Refills: 6      CONTINUE these medications which have NOT CHANGED   Details  cetirizine (ZYRTEC) 10 MG tablet Take 10 mg by mouth daily.    citalopram (CELEXA) 20 MG tablet Take 20 mg by mouth daily.    oxymetazoline (AFRIN) 0.05 % nasal spray Place 1 spray into both nostrils daily as needed for congestion.    traMADol (ULTRAM) 50 MG tablet Take 25 mg by mouth every 6 (six) hours as needed.    traZODone (DESYREL) 50 MG tablet Take 25 mg by mouth  at bedtime as needed for sleep.      STOP taking these medications     omeprazole (PRILOSEC) 20 MG capsule            Outstanding Labs/Studies  Patient will have Echo and follow up appointment after Echo is done.   Duration of Discharge Encounter   Greater than 30 minutes including physician time.  Signed, Little Ishikawa NP 01/30/2016, 4:36 PM  The patient has been seen in conjunction with Suzzette RighterErin Deandre Brannan, NP. All aspects of care have been considered and discussed. The patient has been personally interviewed, examined, and all clinical data has been reviewed.   The patient underwent angiography and was found to have nonobstructive coronary disease.  I am now pleased that his symptoms likely represent reflux. We will change omeprazole to Protonix and discharge today. He will follow-up with his primary physician.  Needs aggressive risk factor modification to prevent progression.

## 2016-01-30 NOTE — Progress Notes (Signed)
ANTICOAGULATION CONSULT NOTE  Pharmacy Consult for Heparin Indication: chest pain/ACS  Allergies  Allergen Reactions  . Ambien [Zolpidem]     Abnormal feeling  . Lexapro [Escitalopram Oxalate]     Patient Measurements: Height: 5\' 10"  (177.8 cm) Weight: 211 lb 11.2 oz (96.026 kg) IBW/kg (Calculated) : 73 Heparin Dosing Weight: 93.3 kg  Vital Signs: Temp: 98 F (36.7 C) (04/18 2122) Temp Source: Oral (04/18 2122) BP: 144/79 mmHg (04/18 2122) Pulse Rate: 68 (04/18 2122)  Labs:  Recent Labs  01/29/16 1452 01/29/16 1817 01/29/16 1940 01/30/16 0128  HGB 13.1  --   --  12.9*  HCT 39.3  --   --  37.8*  PLT 205  --   --  210  APTT  --   --  28  --   LABPROT  --   --  13.1  --   INR  --   --  0.97  --   HEPARINUNFRC  --   --   --  0.46  CREATININE 0.91  --   --   --   TROPONINI  --  <0.03 <0.03  --     Estimated Creatinine Clearance: 109.1 mL/min (by C-G formula based on Cr of 0.91).   Medical History: Past Medical History  Diagnosis Date  . GERD (gastroesophageal reflux disease)     Assessment: 55yo-Male presents with chest pain for several days.  PMH includes GERD only.  Pharmacy consulted to start heparin in the setting of ACS/Chest Pain. Initial heparin level 0.46 on 1300 units/hr. No issues with infusion per RN and CBC stable.  Goal of Therapy:  Heparin level 0.3-0.7 units/ml Monitor platelets by anticoagulation protocol: Yes   Plan:  1. Continue heparin infusion at 1300 units/hr 2. Confirmatory level at 10 am today 3. Continue to monitor H&H and platelets  4. Noted plans for potential cath 4/19  Pollyann SamplesAndy Emagene Merfeld, PharmD, BCPS 01/30/2016, 1:57 AM Pager: [phone]

## 2016-01-30 NOTE — Progress Notes (Signed)
Utilization review completed.  

## 2016-01-30 NOTE — Interval H&P Note (Signed)
Cath Lab Visit (complete for each Cath Lab visit)  Clinical Evaluation Leading to the Procedure:   ACS: Yes.    Non-ACS:    Anginal Classification: CCS IV  Anti-ischemic medical therapy: No Therapy  Non-Invasive Test Results: No non-invasive testing performed  Prior CABG: No previous CABG      History and Physical Interval Note:  01/30/2016 3:04 PM  Tresa ResMatthew C Boateng  has presented today for surgery, with the diagnosis of unstable angina  The various methods of treatment have been discussed with the patient and family. After consideration of risks, benefits and other options for treatment, the patient has consented to  Procedure(s): Left Heart Cath and Coronary Angiography (N/A) as a surgical intervention .  The patient's history has been reviewed, patient examined, no change in status, stable for surgery.  I have reviewed the patient's chart and labs.  Questions were answered to the patient's satisfaction.     Lorine BearsMuhammad Arida

## 2016-01-30 NOTE — Plan of Care (Signed)
Problem: Consults Goal: Cardiac Cath Patient Education (See Patient Education module for education specifics.)  Outcome: Progressing Reviewed heart cath procedure, risks and benefits and answered questions regarding procedure, materials given to patient since he refused to watch the cath videos. Patient verbalized understanding and was able to reiterate what was taught to him.

## 2016-01-30 NOTE — Plan of Care (Signed)
Problem: Education: Goal: Knowledge of [facility] Education information/materials will improve Outcome: Progressing Reviewed the unit rules and how to use the phone, white board and the call light, patient was given a paper with the TV channels and wife was given the phine number to call up to the unit. Patient was given written info regarding his heart cath and I attempted to find a time but his name wasn't on the schedule so instructed the wife to call in the early am. Patient was instructed about his NPO status after midnight and the reasoning behind it. I attempted to give him the video numbers but he refused and I did give him printed material regarding his heart cath and his Heparin drip and NTG patch and reviewed what would be happening with his heart cath risks/benefits and after care. Patient knows he will be getting his right wrist shaved in the am. Patient verbalized understanding but is very apprehensive about this procedure because his father and mother died in this facility and he recently had knee surgery that was compromised by the MD's procedure and he's not sure if he wants to go through this procedure but I was able to explain and hopefully help to ease his mind, will continue to monitor and answer any questions that he may have.

## 2016-01-30 NOTE — H&P (View-Only) (Signed)
  Subjective:  Slight chest pain overnight "2/10". He is very anxious about cath today. He is sure he has a serious heart problem.   Objective:  Vital Signs in the last 24 hours: Temp:  [97.5 F (36.4 C)-98.2 F (36.8 C)] 98.2 F (36.8 C) (04/19 0707) Pulse Rate:  [65-80] 80 (04/19 0707) Resp:  [14-22] 18 (04/19 0707) BP: (121-144)/(76-95) 134/76 mmHg (04/19 0707) SpO2:  [97 %-100 %] 98 % (04/19 0707) Weight:  [210 lb 14.4 oz (95.664 kg)-216 lb (97.977 kg)] 210 lb 14.4 oz (95.664 kg) (04/19 0707)  Intake/Output from previous day:  Intake/Output Summary (Last 24 hours) at 01/30/16 1007 Last data filed at 01/30/16 0804  Gross per 24 hour  Intake 546.52 ml  Output   1115 ml  Net -568.48 ml    Physical Exam: General appearance: alert, cooperative, no distress and mildly obese Lungs: clear to auscultation bilaterally Heart: regular rate and rhythm Extremities: Rt radial 3+/4 Neurologic: Grossly normal   Rate: 78  Rhythm: normal sinus rhythm  Lab Results:  Recent Labs  01/29/16 1452 01/30/16 0128  WBC 3.5* 4.4  HGB 13.1 12.9*  PLT 205 210    Recent Labs  01/29/16 1452 01/30/16 0128  NA 139 139  K 4.0 3.7  CL 105 105  CO2 23 25  GLUCOSE 120* 118*  BUN 10 9  CREATININE 0.91 0.96    Recent Labs  01/30/16 0128 01/30/16 0607  TROPONINI <0.03 <0.03    Recent Labs  01/29/16 1940  INR 0.97    Scheduled Meds: . aspirin  324 mg Oral Pre-Cath  . aspirin EC  81 mg Oral Daily  . atorvastatin  40 mg Oral q1800  . carvedilol  3.125 mg Oral BID WC  . citalopram  20 mg Oral Daily  . loratadine  10 mg Oral Daily  . nitroGLYCERIN  1 inch Topical Q8H  . pantoprazole  40 mg Oral BID AC  . sodium chloride flush  3 mL Intravenous Q12H  . sodium chloride flush  3 mL Intravenous Q12H   Continuous Infusions: . sodium chloride 1 mL/kg/hr (01/30/16 0902)  . heparin 1,300 Units/hr (01/29/16 1851)   PRN Meds:.sodium chloride, sodium chloride, acetaminophen,  ALPRAZolam, morphine injection, nitroGLYCERIN, ondansetron (ZOFRAN) IV, sodium chloride flush, sodium chloride flush, traMADol, traZODone   Imaging: Imaging results have been reviewed   Assessment/Plan:  55 y.o. year old male with a history of GERD, dyslipidemia, and a strong family history of CAD. He has had knee problems and has been out of work and not active. His knee has been getting better and he had increased his activity. He was awakened early Easter morning with chest pain but didn't tell anyone till he told his PCP the following Tuesday. He was sent from PCP's office to [facility] for evaluation. Troponin have been negative. Stable on IV Heparin and NTG paste as well as ASA, statin, beta blocker.   Principal Problem:   Chest pain with moderate risk for cardiac etiology Active Problems:   Family history of coronary artery disease   Dyslipidemia   GERD (gastroesophageal reflux disease)   Anxiety   RBBB   PLAN: Cath today. Valium today x 1 for anxiety.   The patient understands that risks included but are not limited to stroke (1 in 1000), death (1 in 1000), kidney failure [usually temporary] (1 in 500), bleeding (1 in 200), allergic reaction [possibly serious] (1 in 200).  The patient understands and agrees to proceed.     Luke Kilroy PA-C 01/30/2016, 10:07 AM [phone]  The patient has been seen in conjunction with Luke Kilroy, PA-C. All aspects of care have been considered and discussed. The patient has been personally interviewed, examined, and all clinical data has been reviewed.   Agree with note as outlined above.Given family history, age, and nature of symptoms, coronary angiography has been recommended as the definitive study to exclude the possibility of significant obstructive CAD. Plan discharge later today if no significant CAD identified.  If no CAD identified, we need to intensify therapy for potential reflux and follow-up with his primary physician. 

## 2016-01-30 NOTE — Plan of Care (Signed)
Problem: Pain Managment: Goal: General experience of comfort will improve Outcome: Progressing Patient able to rate pain levely, quality, radiation and duration and asks for pain meds as instructed, will continue to monitor.

## 2016-01-30 NOTE — Progress Notes (Signed)
ANTICOAGULATION CONSULT NOTE - FOLLOW UP  Pharmacy Consult:  Heparin Indication: chest pain/ACS  Allergies  Allergen Reactions  . Ambien [Zolpidem]     Abnormal feeling  . Lexapro [Escitalopram Oxalate]     Patient Measurements: Height: 5\' 10"  (177.8 cm) Weight: 210 lb 14.4 oz (95.664 kg) IBW/kg (Calculated) : 73 Heparin Dosing Weight: 93 kg  Vital Signs: Temp: 98.2 F (36.8 C) (04/19 0707) Temp Source: Oral (04/19 0707) BP: 134/76 mmHg (04/19 0707) Pulse Rate: 80 (04/19 0707)  Labs:  Recent Labs  01/29/16 1452  01/29/16 1940 01/30/16 0128 01/30/16 0607  HGB 13.1  --   --  12.9*  --   HCT 39.3  --   --  37.8*  --   PLT 205  --   --  210  --   APTT  --   --  28  --   --   LABPROT  --   --  13.1  --   --   INR  --   --  0.97  --   --   HEPARINUNFRC  --   --   --  0.46  --   CREATININE 0.91  --   --  0.96  --   TROPONINI  --   < > <0.03 <0.03 <0.03  < > = values in this interval not displayed.  Estimated Creatinine Clearance: 103.3 mL/min (by C-G formula based on Cr of 0.96).    Assessment: [age] YOM with ACS to continue on IV heparin.  Heparin level therapeutic; no bleeding reported.   Goal of Therapy:  Heparin level 0.3-0.7 units/ml  Monitor platelets by anticoagulation protocol: Yes    Plan:  - Continue heparin gtt at 1300 units/hr - Daily HL / CBC - F/U post cath   Aalivia Mcgraw D. Laney Potashang, PharmD, BCPS Pager:  [phone] 01/30/2016, 11:31 AM

## 2016-01-30 NOTE — Consult Note (Signed)
[facility] called unit to speak with RN and review if pt had received HCPOA paperwork; RN was going to give pt paperwork and once pt completes forms, to [facility] or spiritual care department.  If paperwork completed after 15:00, notary and witnesses will not be available until next day at 09:30.  Spiritual care dept - [phone] or page [phone]. 10:11 AM Erline LevineMichael I Zakry Caso

## 2016-01-30 NOTE — Research (Signed)
CADLAD Informed Consent   Subject Name: Jose Mason  Subject met inclusion and exclusion criteria.  The informed consent form, study requirements and expectations were reviewed with the subject and questions and concerns were addressed prior to the signing of the consent form.  The subject verbalized understanding of the trail requirements.  The subject agreed to participate in the CADLAD trial and signed the informed consent.  The informed consent was obtained prior to performance of any protocol-specific procedures for the subject.  A copy of the signed informed consent was given to the subject and a copy was placed in the subject's medical record.  Vivian Garman 01/30/2016, 09:50  

## 2016-01-30 NOTE — Progress Notes (Addendum)
Subjective:  Slight chest pain overnight "2/10". He is very anxious about cath today. He is sure he has a serious heart problem.   Objective:  Vital Signs in the last 24 hours: Temp:  [97.5 F (36.4 C)-98.2 F (36.8 C)] 98.2 F (36.8 C) (04/19 0707) Pulse Rate:  [65-80] 80 (04/19 0707) Resp:  [14-22] 18 (04/19 0707) BP: (121-144)/(76-95) 134/76 mmHg (04/19 0707) SpO2:  [97 %-100 %] 98 % (04/19 0707) Weight:  [210 lb 14.4 oz (95.664 kg)-216 lb (97.977 kg)] 210 lb 14.4 oz (95.664 kg) (04/19 0707)  Intake/Output from previous day:  Intake/Output Summary (Last 24 hours) at 01/30/16 1007 Last data filed at 01/30/16 0804  Gross per 24 hour  Intake 546.52 ml  Output   1115 ml  Net -568.48 ml    Physical Exam: General appearance: alert, cooperative, no distress and mildly obese Lungs: clear to auscultation bilaterally Heart: regular rate and rhythm Extremities: Rt radial 3+/4 Neurologic: Grossly normal   Rate: 78  Rhythm: normal sinus rhythm  Lab Results:  Recent Labs  01/29/16 1452 01/30/16 0128  WBC 3.5* 4.4  HGB 13.1 12.9*  PLT 205 210    Recent Labs  01/29/16 1452 01/30/16 0128  NA 139 139  K 4.0 3.7  CL 105 105  CO2 23 25  GLUCOSE 120* 118*  BUN 10 9  CREATININE 0.91 0.96    Recent Labs  01/30/16 0128 01/30/16 0607  TROPONINI <0.03 <0.03    Recent Labs  01/29/16 1940  INR 0.97    Scheduled Meds: . aspirin  324 mg Oral Pre-Cath  . aspirin EC  81 mg Oral Daily  . atorvastatin  40 mg Oral q1800  . carvedilol  3.125 mg Oral BID WC  . citalopram  20 mg Oral Daily  . loratadine  10 mg Oral Daily  . nitroGLYCERIN  1 inch Topical Q8H  . pantoprazole  40 mg Oral BID AC  . sodium chloride flush  3 mL Intravenous Q12H  . sodium chloride flush  3 mL Intravenous Q12H   Continuous Infusions: . sodium chloride 1 mL/kg/hr (01/30/16 0902)  . heparin 1,300 Units/hr (01/29/16 1851)   PRN Meds:.sodium chloride, sodium chloride, acetaminophen,  ALPRAZolam, morphine injection, nitroGLYCERIN, ondansetron (ZOFRAN) IV, sodium chloride flush, sodium chloride flush, traMADol, traZODone   Imaging: Imaging results have been reviewed   Assessment/Plan:  55 y.o. year old male with a history of GERD, dyslipidemia, and a strong family history of CAD. He has had knee problems and has been out of work and not active. His knee has been getting better and he had increased his activity. He was awakened early Easter morning with chest pain but didn't tell anyone till he told his PCP the following Tuesday. [facility] PCP[facility]. Troponin have been negative. Stable on IV Heparin and NTG paste as well as ASA, statin, beta blocker.   Principal Problem:   Chest pain with moderate risk for cardiac etiology Active Problems:   Family history of coronary artery disease   Dyslipidemia   GERD (gastroesophageal reflux disease)   Anxiety   RBBB   PLAN: Cath today. Valium today x 1 for anxiety.   The patient understands that risks included but are not limited to stroke (1 in 1000), death (1 in 1000), kidney failure [usually temporary] (1 in 500), bleeding (1 in 200), allergic reaction [possibly serious] (1 in 200).  The patient understands and agrees to proceed.  Corine Shelter PA-C 01/30/2016, 10:07 AM [phone]  The patient has been seen in conjunction with Corine Shelter, PA-C. All aspects of care have been considered and discussed. The patient has been personally interviewed, examined, and all clinical data has been reviewed.   Agree with note as outlined above.Given family history, age, and nature of symptoms, coronary angiography has been recommended as the definitive study to exclude the possibility of significant obstructive CAD. Plan discharge later today if no significant CAD identified.  If no CAD identified, we need to intensify therapy for potential reflux and follow-up with his primary physician.

## 2016-01-31 ENCOUNTER — Encounter (HOSPITAL_COMMUNITY): Payer: Self-pay | Admitting: Cardiovascular Disease

## 2016-01-31 LAB — HEMOGLOBIN A1C
HEMOGLOBIN A1C: 5.8 % — AB (ref 4.8–5.6)
Mean Plasma Glucose: 120 mg/dL

## 2016-02-14 ENCOUNTER — Ambulatory Visit (HOSPITAL_COMMUNITY): Payer: BLUE CROSS/BLUE SHIELD | Attending: Cardiology

## 2016-02-14 ENCOUNTER — Other Ambulatory Visit: Payer: Self-pay

## 2016-02-14 DIAGNOSIS — Z8249 Family history of ischemic heart disease and other diseases of the circulatory system: Secondary | ICD-10-CM | POA: Insufficient documentation

## 2016-02-14 DIAGNOSIS — I340 Nonrheumatic mitral (valve) insufficiency: Secondary | ICD-10-CM | POA: Insufficient documentation

## 2016-02-14 DIAGNOSIS — R079 Chest pain, unspecified: Secondary | ICD-10-CM

## 2016-02-19 ENCOUNTER — Ambulatory Visit (INDEPENDENT_AMBULATORY_CARE_PROVIDER_SITE_OTHER): Payer: BLUE CROSS/BLUE SHIELD | Admitting: Cardiology

## 2016-02-19 ENCOUNTER — Encounter: Payer: Self-pay | Admitting: Cardiology

## 2016-02-19 VITALS — BP 124/82 | HR 66 | Ht 70.0 in | Wt 214.8 lb

## 2016-02-19 DIAGNOSIS — E785 Hyperlipidemia, unspecified: Secondary | ICD-10-CM

## 2016-02-19 NOTE — Patient Instructions (Signed)
Medication Instructions:  Your physician recommends that you continue on your current medications as directed. Please refer to the Current Medication list given to you today.   Labwork: Your physician recommends that you return for a FASTING lipid profile and lft in 4 weeks    Testing/Procedures: None ordered  Follow-Up: Your physician wants you to follow-up in: 1 year with Dr.Smith You will receive a reminder letter in the mail two months in advance. If you don't receive a letter, please call our office to schedule the follow-up appointment.   Any Other Special Instructions Will Be Listed Below (If Applicable).     If you need a refill on your cardiac medications before your next appointment, please call your pharmacy.

## 2016-02-19 NOTE — Progress Notes (Signed)
02/19/2016 Jose Mason   06/08/1961  [id number]  Primary Physician PERRY,LAWRENCE Ramon DredgeEDWARD, MD Primary Cardiologist:  Dr. Katrinka BlazingSmith   Reason for Visit/CC: [facility]  HPI:  [age] y/o male, new to our office, who presents for post hospital f/u. He was recently admitted by Dr. Katrinka BlazingSmith for CP evaluation. He has a strong family h/o CAD. He presented with complaints of chest pressure/tightness. He ruled out for MI with negative cardiac enzymes, however given his risk factors LHC was recommended. This was performed by Dr. Kirke CorinArida 01/29/16. He was found to have mild nonobstructive CAD with a 10% proximal to mid RCA, 50% ostial 1st Mrg and a 30% mid LCX. LVEF was normal. EF was 60-65% by echo. There were no significant valve abnormalities. Medical therapy + agressive risk factor modification was recommended. He was placed on ASA, Coreg and Lipitor (LDL 136 mgd/L).  Today in clinic, he reports that he has done well. He denies any recurrent CP. No dyspnea. He has been fully compliant with his meds. He denies any intolerances/ side effects. He has changed his diet (low fat/ low sodium and low sugar). He is also trying to increases his physical activity.    Current Outpatient Prescriptions  Medication Sig Dispense Refill  . aspirin EC 81 MG EC tablet Take 1 tablet (81 mg total) by mouth daily. 30 tablet   . atorvastatin (LIPITOR) 40 MG tablet Take 1 tablet (40 mg total) by mouth daily at 6 PM. 30 tablet 6  . carvedilol (COREG) 3.125 MG tablet Take 1 tablet (3.125 mg total) by mouth 2 (two) times daily with a meal. 60 tablet 12  . cetirizine (ZYRTEC) 10 MG tablet Take 10 mg by mouth daily.    . citalopram (CELEXA) 20 MG tablet Take 20 mg by mouth daily.    . nitroGLYCERIN (NITROSTAT) 0.4 MG SL tablet Place 1 tablet (0.4 mg total) under the tongue every 5 (five) minutes x 3 doses as needed for chest pain. 25 tablet 2  . oxymetazoline (AFRIN) 0.05 % nasal spray Place 1 spray into both nostrils daily  as needed for congestion.    . pantoprazole (PROTONIX) 40 MG tablet Take 1 tablet (40 mg total) by mouth 2 (two) times daily before a meal. 60 tablet 6  . traMADol (ULTRAM) 50 MG tablet Take 25 mg by mouth every 6 (six) hours as needed.    . traZODone (DESYREL) 50 MG tablet Take 25 mg by mouth at bedtime as needed for sleep.     No current facility-administered medications for this visit.    Allergies  Allergen Reactions  . Ambien [Zolpidem]     Abnormal feeling  . Lexapro [Escitalopram Oxalate]     Social History   Social History  . Marital Status: Married    Spouse Name: N/A  . Number of Children: N/A  . Years of Education: N/A   Occupational History  . Unemployed    Social History Main Topics  . Smoking status: Never Smoker   . Smokeless tobacco: Never Used  . Alcohol Use: 0.0 oz/week    0 Standard drinks or equivalent per week     Comment: rare beer  . Drug Use: No  . Sexual Activity: Not on file   Other Topics Concern  . Not on file   Social History Narrative   [place]ives in [place], [place] with [place].     Review of Systems: General: negative for chills, fever, night sweats or weight changes.  Cardiovascular: negative for chest pain, dyspnea on exertion, edema, orthopnea, palpitations, paroxysmal nocturnal dyspnea or shortness of breath Dermatological: negative for rash Respiratory: negative for cough or wheezing Urologic: negative for hematuria Abdominal: negative for nausea, vomiting, diarrhea, bright red blood per rectum, melena, or hematemesis Neurologic: negative for visual changes, syncope, or dizziness All other systems reviewed and are otherwise negative except as noted above.    Blood pressure 124/82, pulse 66, height  (1.778 m), weight 214 lb 12.8 oz (97.433 kg).  General appearance: alert, cooperative and no distress Neck: no carotid bruit and no JVD Lungs: clear to auscultation bilaterally Heart: regular rate and rhythm, S1, S2 normal, no  murmur, click, rub or gallop Extremities: no LEE Pulses: 2+ and symmetric Skin: warm and dry Neurologic: Alert and oriented X 3, normal strength and tone. Normal symmetric reflexes. Normal coordination and gait  EKG not performed  ASSESSMENT AND PLAN:   1. CAD: mild nonobstructive CAD as outlined above. Continue medical therapy + aggressive risk factor modification. Continue ASA, statin and BB. Patient encouraged to stick to new heart healthy diet and increase physical activity.   2. HLD: LDL elevated at 136 mgd/L. Goal is at least <100, <70 more ideal. Continue Lipitor. We will recheck a FLP and HFTs in 4 weeks.   3. HTN: BP is well controlled on Coreg.   4. Pre-diabetes: Hgb A1c was 5.8. Patient advised to f/u with PCP for continued monitoring.   PLAN  F/u with Dr. Katrinka Blazing in 1 year.  Robbie Lis PA-C 02/19/2016 9:13 AM

## 2016-03-21 ENCOUNTER — Other Ambulatory Visit (INDEPENDENT_AMBULATORY_CARE_PROVIDER_SITE_OTHER): Payer: BLUE CROSS/BLUE SHIELD | Admitting: *Deleted

## 2016-03-21 DIAGNOSIS — E785 Hyperlipidemia, unspecified: Secondary | ICD-10-CM | POA: Diagnosis not present

## 2016-03-21 LAB — HEPATIC FUNCTION PANEL
ALT: 50 U/L — AB (ref 9–46)
AST: 27 U/L (ref 10–35)
Albumin: 4.6 g/dL (ref 3.6–5.1)
Alkaline Phosphatase: 104 U/L (ref 40–115)
BILIRUBIN DIRECT: 0.1 mg/dL (ref <=0.2)
BILIRUBIN INDIRECT: 0.5 mg/dL (ref 0.2–1.2)
Total Bilirubin: 0.6 mg/dL (ref 0.2–1.2)
Total Protein: 6.7 g/dL (ref 6.1–8.1)

## 2016-03-21 LAB — LIPID PANEL
CHOLESTEROL: 143 mg/dL (ref 125–200)
HDL: 40 mg/dL (ref >=40)
LDL CALC: 73 mg/dL (ref <130)
Total CHOL/HDL Ratio: 3.6 Ratio (ref <=5.0)
Triglycerides: 150 mg/dL — ABNORMAL HIGH (ref <150)
VLDL: 30 mg/dL (ref <30)

## 2016-11-01 IMAGING — NM NM BONE 3 PHASE
2 series · 12 of 12 positions shown · non-contrast
Comparison: None; correlation MR LEFT knee 02/14/2015

CLINICAL DATA: LEFT knee pain, work related injury 10/05/2014, had
surgical repair of LEFT medial meniscal tear, chronic postoperative
pain, complex tear medial meniscus of LEFT knee as current injury,
sequela, LEFT chondromalacia patella

EXAM:
NUCLEAR MEDICINE 3-PHASE BONE SCAN
TECHNIQUE: Radionuclide angiographic images, immediate static blood pool
images, and 3-hour delayed static images were obtained of the knees
after intravenous injection of radiopharmaceutical.
RADIOPHARMACEUTICALS:  25.2 mCi Yc-55m MDP IV

[Series 1: flow · 4.14mm/px · 6 of 40 frames shown (1 of 2)]
[frame 4/40  full-range]
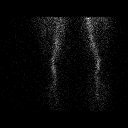
[frame 10/40  full-range]
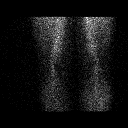
[frame 17/40  full-range]
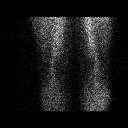
[frame 24/40  full-range]
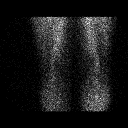
[frame 30/40  full-range]
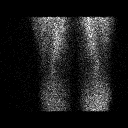
[frame 37/40  full-range]
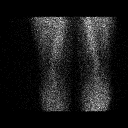

[Series 1: flow · 4.14mm/px · 6 of 40 frames shown (2 of 2)]
[frame 4/40  full-range]
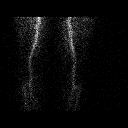
[frame 10/40  full-range]
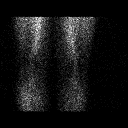
[frame 17/40  full-range]
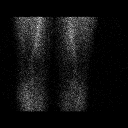
[frame 24/40  full-range]
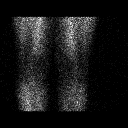
[frame 30/40  full-range]
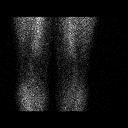
[frame 37/40  full-range]
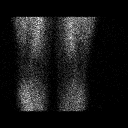

[12 of 12 positions shown; findings below may reference images not displayed]

FINDINGS: Vascular phase: Symmetric blood flow to both knees

Blood pool phase: Symmetric blood pool at both knees

Delayed phase: Minimal delayed increased tracer localization at the
RIGHT patella. Minimal tracer uptake at the medial knee joints
bilaterally compatible with minimal degenerative disease. No focal
areas of abnormal increased trace localization otherwise identified.
IMPRESSION: Minimal degenerative type uptake of tracer at both knees.

Otherwise negative exam.

## 2017-01-21 IMAGING — DX DG CHEST 2V
2 series · 2 of 2 positions shown · non-contrast
Comparison: None.

CLINICAL DATA: Chest pain for 2 days

EXAM:
CHEST  2 VIEW

[chest pa]
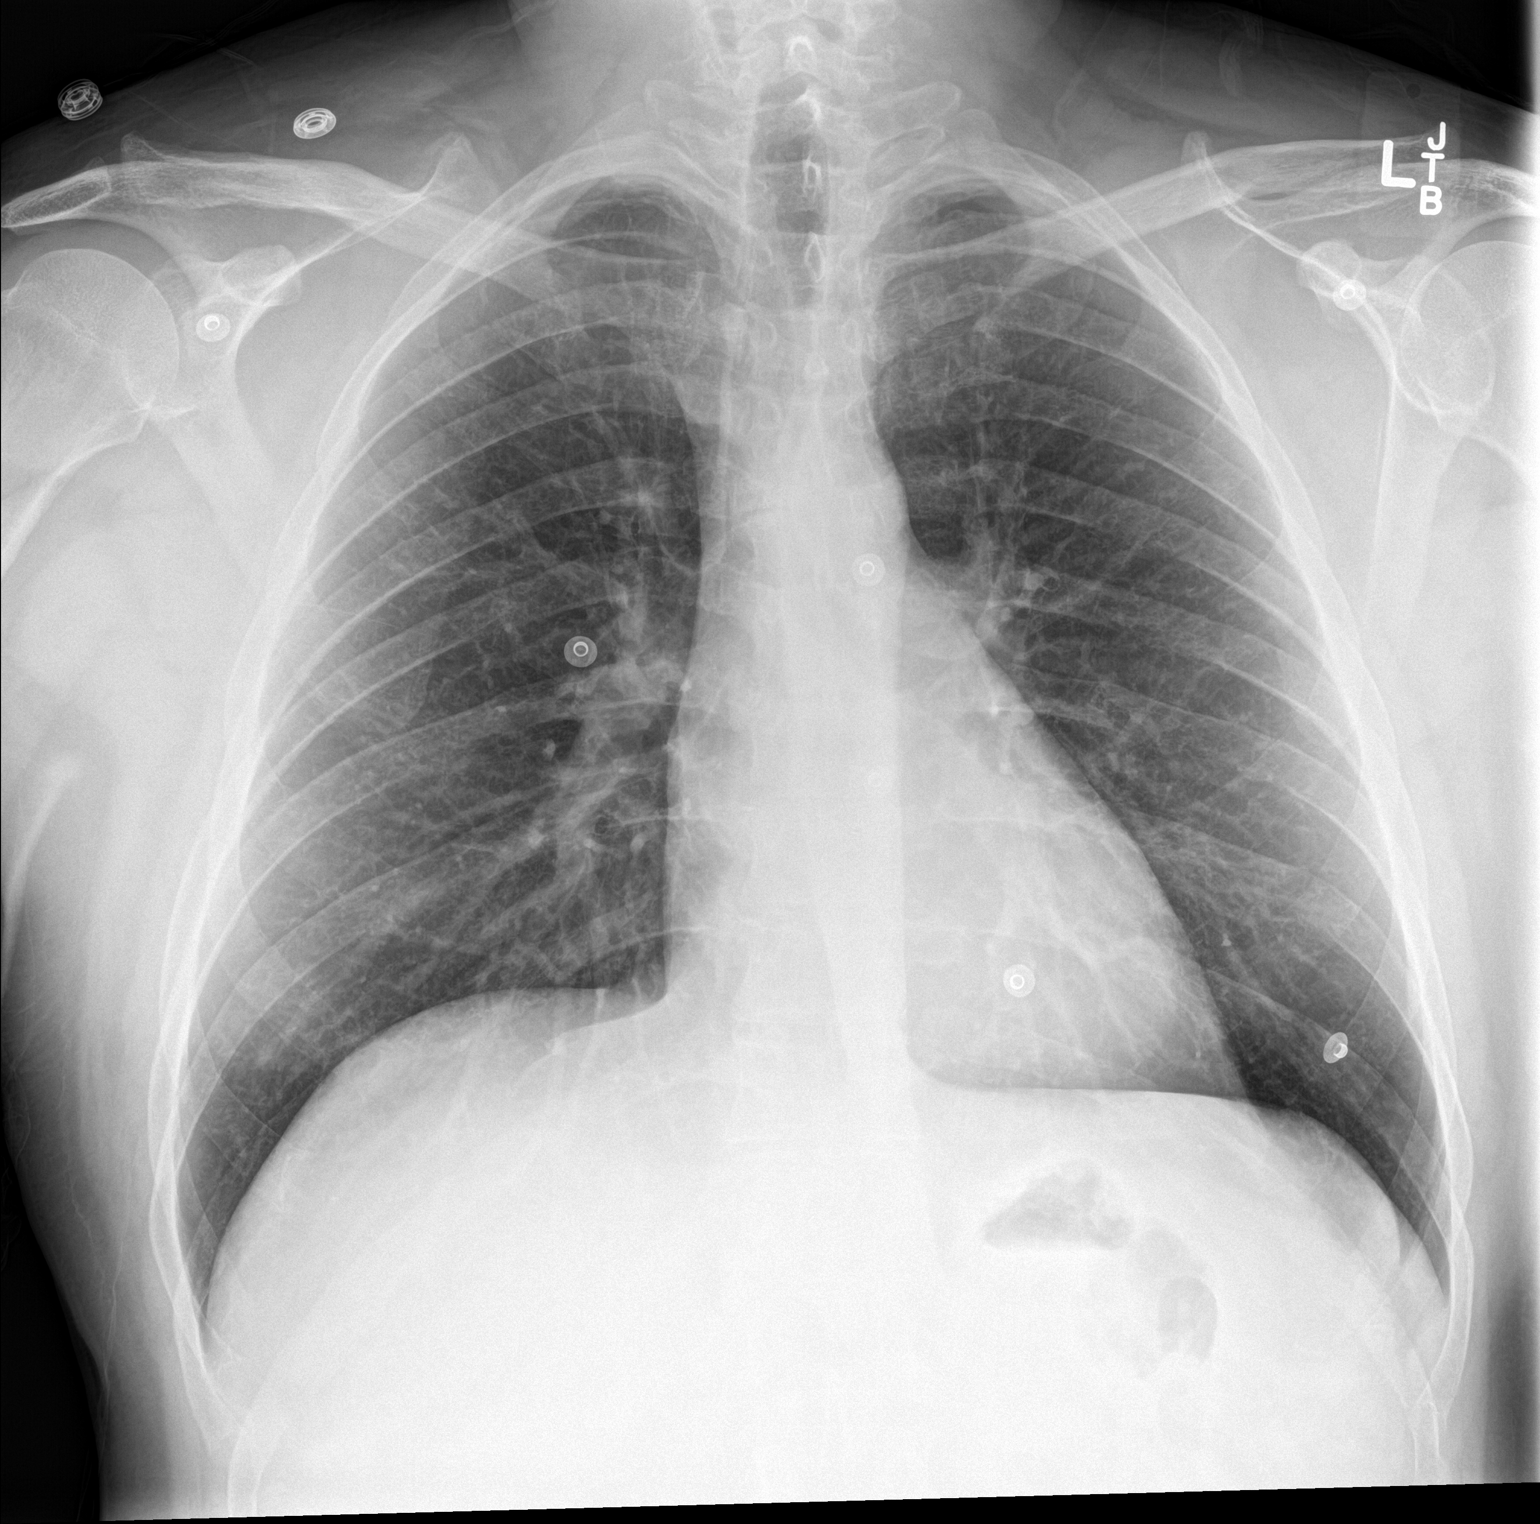

[chest lat]
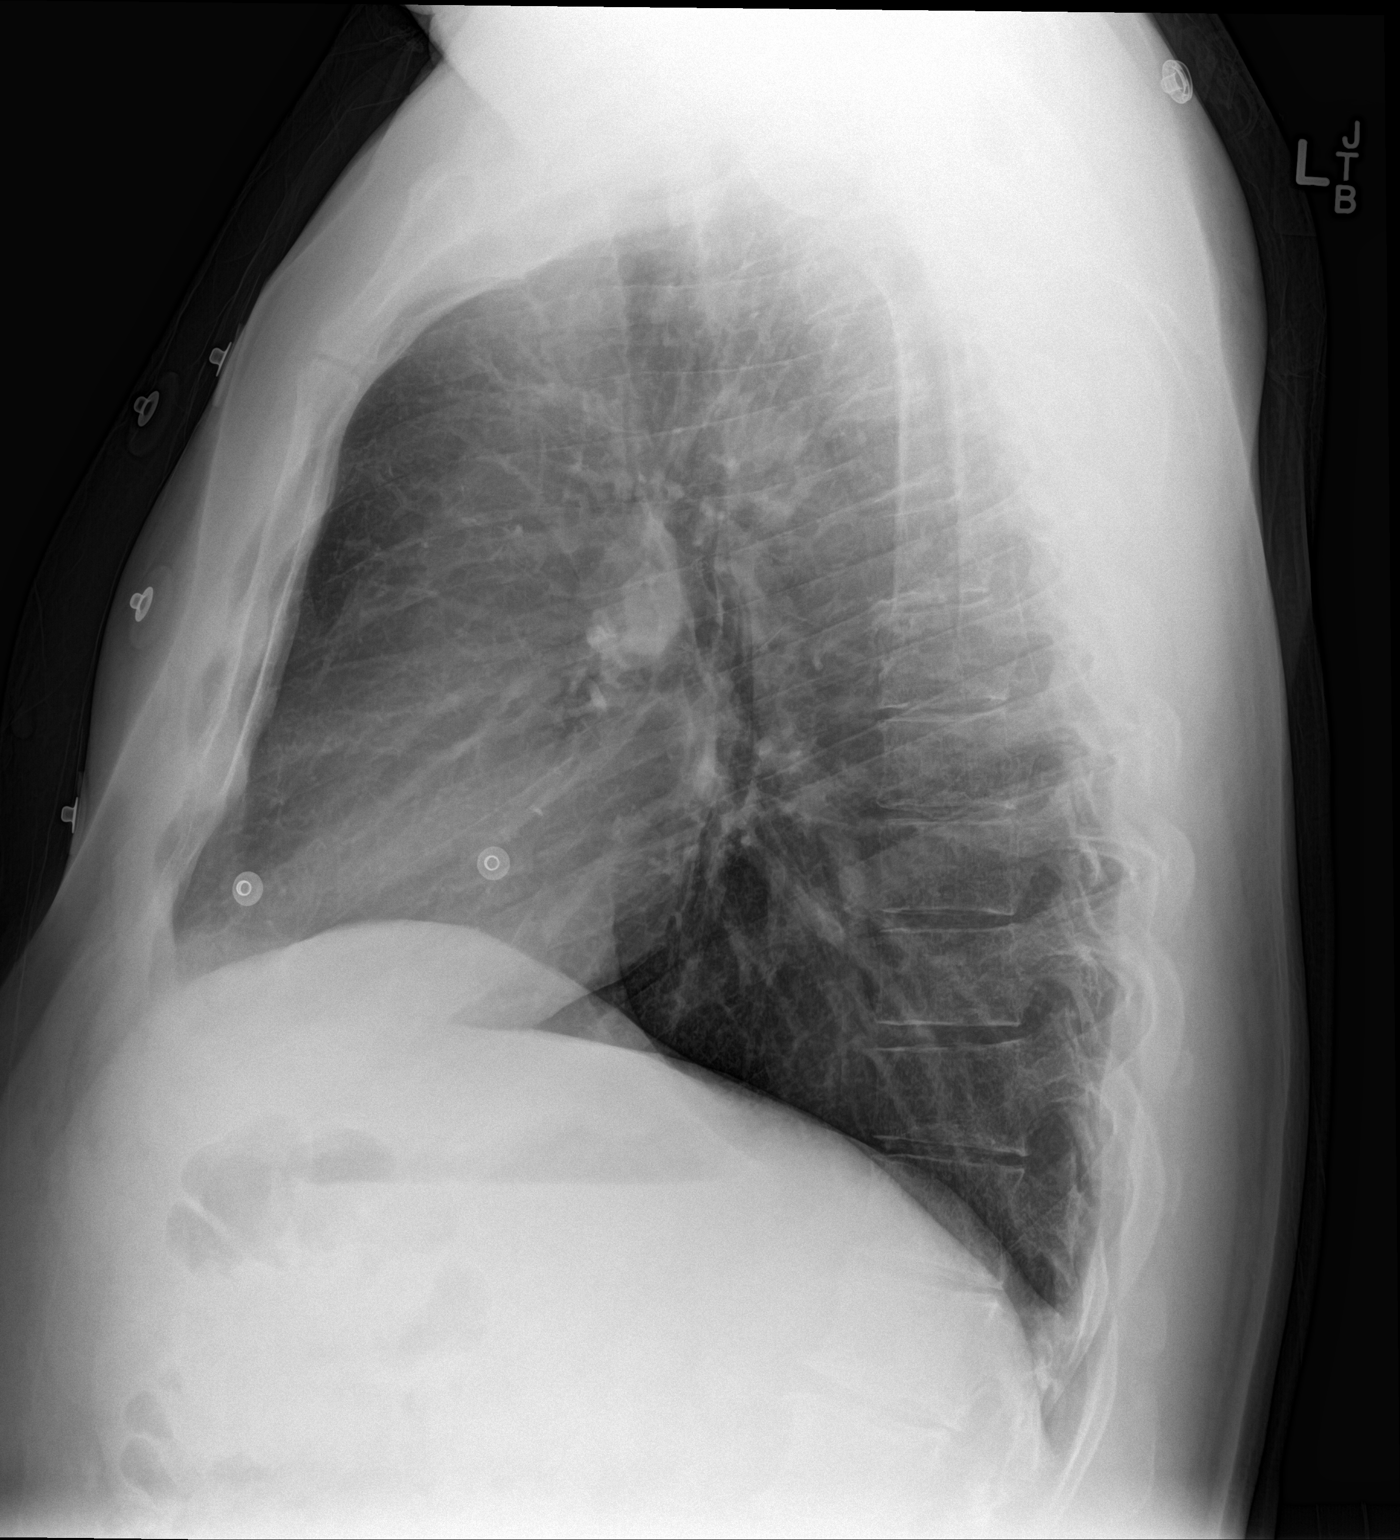

[2 of 2 positions shown; findings below may reference images not displayed]

FINDINGS: Lungs are clear. Heart size and pulmonary vascularity are normal. No
adenopathy. No pneumothorax. No bone lesions.
IMPRESSION: No edema or consolidation.

## 2017-07-27 ENCOUNTER — Ambulatory Visit (INDEPENDENT_AMBULATORY_CARE_PROVIDER_SITE_OTHER): Payer: BLUE CROSS/BLUE SHIELD | Admitting: Allergy and Immunology

## 2017-07-27 ENCOUNTER — Encounter: Payer: Self-pay | Admitting: Allergy and Immunology

## 2017-07-27 VITALS — BP 130/80 | HR 80 | Temp 98.4°F | Resp 20 | Ht 69.0 in | Wt 210.0 lb

## 2017-07-27 DIAGNOSIS — J3089 Other allergic rhinitis: Secondary | ICD-10-CM | POA: Diagnosis not present

## 2017-07-27 DIAGNOSIS — T485X1A Poisoning by other anti-common-cold drugs, accidental (unintentional), initial encounter: Secondary | ICD-10-CM | POA: Diagnosis not present

## 2017-07-27 DIAGNOSIS — J310 Chronic rhinitis: Secondary | ICD-10-CM | POA: Diagnosis not present

## 2017-07-27 DIAGNOSIS — T485X5A Adverse effect of other anti-common-cold drugs, initial encounter: Secondary | ICD-10-CM

## 2017-07-27 DIAGNOSIS — K219 Gastro-esophageal reflux disease without esophagitis: Secondary | ICD-10-CM

## 2017-07-27 DIAGNOSIS — R51 Headache: Secondary | ICD-10-CM | POA: Diagnosis not present

## 2017-07-27 DIAGNOSIS — R519 Headache, unspecified: Secondary | ICD-10-CM

## 2017-07-27 MED ORDER — RANITIDINE HCL 300 MG PO TABS: ORAL_TABLET | ORAL | 5 refills | Status: DC

## 2017-07-27 MED ORDER — MONTELUKAST SODIUM 10 MG PO TABS: ORAL_TABLET | ORAL | 5 refills | Status: DC
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
## 2017-07-27 NOTE — Patient Instructions (Addendum)
  1. Allergen avoidance measures  2. Treat and prevent inflammation:   A. OTC Nasacort/Rhinocort one spray each nostril one time per day  B. Montelukast 10 mg tablet 1 time per day  3. Treat and prevent reflux:   A. Consolidate all caffeine and chocolate consumption slowly  B. Continue pantoprazole 40 mg twice a day  C. Start ranitidine 300 mg in evening  4. May continue Afrin at night while using Nasacort/Rhinocort for now  5. If needed:   A. Nasal saline spray/wash  B. OTC Mucinex DM 2 tablets twice a day  C. OTC antihistamine - Claritin/Zyrtec/Allegra - one time per day  6. Return to clinic in 3 weeks  7. Imaging study of airway? Yes, if not doing well

## 2017-08-17 ENCOUNTER — Encounter: Payer: Self-pay | Admitting: Allergy and Immunology

## 2017-08-17 ENCOUNTER — Ambulatory Visit: Payer: BLUE CROSS/BLUE SHIELD | Admitting: Allergy and Immunology

## 2017-08-17 VITALS — BP 128/76 | HR 80 | Resp 18

## 2017-08-17 DIAGNOSIS — T485X1A Poisoning by other anti-common-cold drugs, accidental (unintentional), initial encounter: Secondary | ICD-10-CM

## 2017-08-17 DIAGNOSIS — J310 Chronic rhinitis: Secondary | ICD-10-CM | POA: Diagnosis not present

## 2017-08-17 DIAGNOSIS — J3089 Other allergic rhinitis: Secondary | ICD-10-CM | POA: Diagnosis not present

## 2017-08-17 DIAGNOSIS — R51 Headache: Secondary | ICD-10-CM

## 2017-08-17 DIAGNOSIS — R519 Headache, unspecified: Secondary | ICD-10-CM

## 2017-08-17 DIAGNOSIS — K219 Gastro-esophageal reflux disease without esophagitis: Secondary | ICD-10-CM | POA: Diagnosis not present

## 2017-08-17 DIAGNOSIS — T485X5A Adverse effect of other anti-common-cold drugs, initial encounter: Secondary | ICD-10-CM

## 2017-08-17 NOTE — Patient Instructions (Addendum)
  1.  Continue to allergen avoidance measures as best as possible  2.  Continue to treat and prevent inflammation:   A. OTC Nasacort one spray each nostril one time per day  B. Montelukast 10 mg tablet 1 time per day  3.  Continue to treat and prevent reflux:   A. Consolidate all caffeine and chocolate consumption    B. pantoprazole 40 mg twice a day  C. ranitidine 300 mg in evening  4. May continue Afrin at night while using Nasacort for now.  Attempt to slowly taper off Afrin use.  5. If needed:     A. Nasal saline spray/wash  B. OTC Mucinex DM 2 tablets twice a day  C. OTC antihistamine - Claritin/Zyrtec/Allegra - one time per day  6.  For this most recent episode use prednisone 10 mg 1 time a day for the next 5 days only  7.  Consider a course of immunotherapy directed against mold and cat and dog.  8. Return to clinic in 12 weeks
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
## 2017-08-18 ENCOUNTER — Encounter: Payer: Self-pay | Admitting: Allergy and Immunology

## 2017-08-20 ENCOUNTER — Other Ambulatory Visit: Payer: Self-pay | Admitting: Allergy and Immunology

## 2017-08-20 DIAGNOSIS — J3089 Other allergic rhinitis: Secondary | ICD-10-CM

## 2017-08-20 NOTE — Progress Notes (Signed)
VIALS EXP 08-21-18 

## 2017-08-21 DIAGNOSIS — J3081 Allergic rhinitis due to animal (cat) (dog) hair and dander: Secondary | ICD-10-CM | POA: Diagnosis not present

## 2017-08-24 DIAGNOSIS — J3089 Other allergic rhinitis: Secondary | ICD-10-CM | POA: Diagnosis not present

## 2017-08-31 ENCOUNTER — Ambulatory Visit: Payer: BLUE CROSS/BLUE SHIELD

## 2017-08-31 ENCOUNTER — Ambulatory Visit (INDEPENDENT_AMBULATORY_CARE_PROVIDER_SITE_OTHER): Payer: BLUE CROSS/BLUE SHIELD

## 2017-08-31 DIAGNOSIS — J3089 Other allergic rhinitis: Secondary | ICD-10-CM | POA: Diagnosis not present

## 2017-08-31 MED ORDER — EPINEPHRINE 0.3 MG/0.3ML IJ SOAJ: 0.3000 mg | Freq: Once | INTRAMUSCULAR | 1 refills | Status: AC

## 2017-08-31 NOTE — Progress Notes (Signed)
Immunotherapy   Patient Details  Name: Tresa ResMatthew C Hogrefe MRN: [id number] Date of Birth: 02/11/1961  08/31/2017  Tresa ResMatthew C Covey started injections for  molds, cat-dog Following schedule: B  Frequency:1-2 times weekly Epi-Pen:Prescription for Epi-Pen given Consent signed and patient instructions given.  Patient tolerated injections well with no local reaction.   Dorathy DaftKayla I Renne Platts 08/31/2017, 10:39 AM

## 2017-09-07 ENCOUNTER — Ambulatory Visit (INDEPENDENT_AMBULATORY_CARE_PROVIDER_SITE_OTHER): Payer: BLUE CROSS/BLUE SHIELD | Admitting: *Deleted

## 2017-09-07 DIAGNOSIS — J3089 Other allergic rhinitis: Secondary | ICD-10-CM | POA: Diagnosis not present

## 2017-09-11 ENCOUNTER — Ambulatory Visit (INDEPENDENT_AMBULATORY_CARE_PROVIDER_SITE_OTHER): Payer: BLUE CROSS/BLUE SHIELD | Admitting: *Deleted

## 2017-09-11 DIAGNOSIS — J3089 Other allergic rhinitis: Secondary | ICD-10-CM

## 2017-09-17 ENCOUNTER — Ambulatory Visit (INDEPENDENT_AMBULATORY_CARE_PROVIDER_SITE_OTHER): Payer: BLUE CROSS/BLUE SHIELD | Admitting: *Deleted

## 2017-09-17 DIAGNOSIS — J3089 Other allergic rhinitis: Secondary | ICD-10-CM | POA: Diagnosis not present

## 2017-09-25 ENCOUNTER — Ambulatory Visit (INDEPENDENT_AMBULATORY_CARE_PROVIDER_SITE_OTHER): Payer: BLUE CROSS/BLUE SHIELD | Admitting: *Deleted

## 2017-09-25 DIAGNOSIS — J3089 Other allergic rhinitis: Secondary | ICD-10-CM

## 2017-09-28 ENCOUNTER — Ambulatory Visit (INDEPENDENT_AMBULATORY_CARE_PROVIDER_SITE_OTHER): Payer: BLUE CROSS/BLUE SHIELD | Admitting: *Deleted

## 2017-09-28 DIAGNOSIS — J3089 Other allergic rhinitis: Secondary | ICD-10-CM | POA: Diagnosis not present

## 2017-10-09 ENCOUNTER — Ambulatory Visit (INDEPENDENT_AMBULATORY_CARE_PROVIDER_SITE_OTHER): Payer: BLUE CROSS/BLUE SHIELD | Admitting: *Deleted

## 2017-10-09 DIAGNOSIS — J3089 Other allergic rhinitis: Secondary | ICD-10-CM | POA: Diagnosis not present

## 2017-10-19 ENCOUNTER — Ambulatory Visit (INDEPENDENT_AMBULATORY_CARE_PROVIDER_SITE_OTHER): Payer: BLUE CROSS/BLUE SHIELD

## 2017-10-19 DIAGNOSIS — J3089 Other allergic rhinitis: Secondary | ICD-10-CM | POA: Diagnosis not present

## 2017-10-30 ENCOUNTER — Ambulatory Visit (INDEPENDENT_AMBULATORY_CARE_PROVIDER_SITE_OTHER): Payer: BLUE CROSS/BLUE SHIELD

## 2017-10-30 DIAGNOSIS — J3089 Other allergic rhinitis: Secondary | ICD-10-CM | POA: Diagnosis not present

## 2017-11-06 ENCOUNTER — Ambulatory Visit (INDEPENDENT_AMBULATORY_CARE_PROVIDER_SITE_OTHER): Payer: BLUE CROSS/BLUE SHIELD | Admitting: *Deleted

## 2017-11-06 DIAGNOSIS — J3089 Other allergic rhinitis: Secondary | ICD-10-CM

## 2017-11-09 ENCOUNTER — Encounter: Payer: Self-pay | Admitting: Allergy and Immunology

## 2017-11-09 ENCOUNTER — Ambulatory Visit: Payer: BLUE CROSS/BLUE SHIELD | Admitting: Allergy and Immunology

## 2017-11-09 VITALS — BP 144/88 | HR 76 | Resp 20

## 2017-11-09 DIAGNOSIS — K219 Gastro-esophageal reflux disease without esophagitis: Secondary | ICD-10-CM

## 2017-11-09 DIAGNOSIS — J3089 Other allergic rhinitis: Secondary | ICD-10-CM

## 2017-11-09 DIAGNOSIS — J309 Allergic rhinitis, unspecified: Secondary | ICD-10-CM | POA: Diagnosis not present

## 2017-11-09 DIAGNOSIS — R51 Headache: Secondary | ICD-10-CM | POA: Diagnosis not present

## 2017-11-09 DIAGNOSIS — R519 Headache, unspecified: Secondary | ICD-10-CM
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
## 2017-11-09 NOTE — Patient Instructions (Signed)
  1.  Continue to allergen avoidance measures as best as possible  2.  Continue to treat and prevent inflammation:   A. OTC Nasacort one spray each nostril one time per day  B. Montelukast 10 mg tablet 1 time per day  3.  Continue to treat and prevent reflux:   A. Consolidate all caffeine and chocolate consumption    B. pantoprazole 40 mg twice a day  C. ranitidine 300 mg in evening  4. If needed:     A. Nasal saline spray/wash  B. OTC Mucinex DM 2 tablets twice a day  C. OTC antihistamine - Claritin/Zyrtec/Allegra - one time per day  5. Continue immunotherapy and Epi-Pen / Auvi-Q  6. Return to clinic in 6 months

## 2017-11-10 ENCOUNTER — Encounter: Payer: Self-pay | Admitting: Allergy and Immunology

## 2017-11-16 ENCOUNTER — Ambulatory Visit (INDEPENDENT_AMBULATORY_CARE_PROVIDER_SITE_OTHER): Payer: BLUE CROSS/BLUE SHIELD | Admitting: *Deleted

## 2017-11-16 DIAGNOSIS — J3089 Other allergic rhinitis: Secondary | ICD-10-CM | POA: Diagnosis not present

## 2017-11-19 ENCOUNTER — Ambulatory Visit (INDEPENDENT_AMBULATORY_CARE_PROVIDER_SITE_OTHER): Payer: BLUE CROSS/BLUE SHIELD

## 2017-11-19 DIAGNOSIS — J3089 Other allergic rhinitis: Secondary | ICD-10-CM | POA: Diagnosis not present

## 2017-11-23 ENCOUNTER — Ambulatory Visit (INDEPENDENT_AMBULATORY_CARE_PROVIDER_SITE_OTHER): Payer: BLUE CROSS/BLUE SHIELD | Admitting: *Deleted

## 2017-11-23 DIAGNOSIS — J3089 Other allergic rhinitis: Secondary | ICD-10-CM | POA: Diagnosis not present

## 2017-12-10 ENCOUNTER — Ambulatory Visit (INDEPENDENT_AMBULATORY_CARE_PROVIDER_SITE_OTHER): Payer: BLUE CROSS/BLUE SHIELD | Admitting: *Deleted

## 2017-12-10 DIAGNOSIS — J3089 Other allergic rhinitis: Secondary | ICD-10-CM

## 2017-12-21 ENCOUNTER — Ambulatory Visit (INDEPENDENT_AMBULATORY_CARE_PROVIDER_SITE_OTHER): Payer: BLUE CROSS/BLUE SHIELD | Admitting: *Deleted

## 2017-12-21 DIAGNOSIS — J3089 Other allergic rhinitis: Secondary | ICD-10-CM

## 2018-01-11 ENCOUNTER — Ambulatory Visit (INDEPENDENT_AMBULATORY_CARE_PROVIDER_SITE_OTHER): Payer: BLUE CROSS/BLUE SHIELD | Admitting: *Deleted

## 2018-01-11 DIAGNOSIS — J3089 Other allergic rhinitis: Secondary | ICD-10-CM

## 2018-01-25 ENCOUNTER — Ambulatory Visit (INDEPENDENT_AMBULATORY_CARE_PROVIDER_SITE_OTHER): Payer: BLUE CROSS/BLUE SHIELD | Admitting: *Deleted

## 2018-01-25 DIAGNOSIS — J3089 Other allergic rhinitis: Secondary | ICD-10-CM

## 2018-02-01 ENCOUNTER — Other Ambulatory Visit: Payer: Self-pay | Admitting: Allergy and Immunology

## 2018-02-12 ENCOUNTER — Ambulatory Visit (INDEPENDENT_AMBULATORY_CARE_PROVIDER_SITE_OTHER): Payer: BLUE CROSS/BLUE SHIELD | Admitting: *Deleted

## 2018-02-12 DIAGNOSIS — J3089 Other allergic rhinitis: Secondary | ICD-10-CM

## 2018-02-22 ENCOUNTER — Ambulatory Visit (INDEPENDENT_AMBULATORY_CARE_PROVIDER_SITE_OTHER): Payer: BLUE CROSS/BLUE SHIELD | Admitting: *Deleted

## 2018-02-22 DIAGNOSIS — J3089 Other allergic rhinitis: Secondary | ICD-10-CM | POA: Diagnosis not present

## 2018-03-15 ENCOUNTER — Ambulatory Visit (INDEPENDENT_AMBULATORY_CARE_PROVIDER_SITE_OTHER): Payer: BLUE CROSS/BLUE SHIELD | Admitting: *Deleted

## 2018-03-15 DIAGNOSIS — J3089 Other allergic rhinitis: Secondary | ICD-10-CM

## 2018-03-22 ENCOUNTER — Ambulatory Visit (INDEPENDENT_AMBULATORY_CARE_PROVIDER_SITE_OTHER): Payer: BLUE CROSS/BLUE SHIELD | Admitting: *Deleted

## 2018-03-22 DIAGNOSIS — J3089 Other allergic rhinitis: Secondary | ICD-10-CM | POA: Diagnosis not present

## 2018-04-09 ENCOUNTER — Ambulatory Visit (INDEPENDENT_AMBULATORY_CARE_PROVIDER_SITE_OTHER): Payer: BLUE CROSS/BLUE SHIELD | Admitting: *Deleted

## 2018-04-09 DIAGNOSIS — J3089 Other allergic rhinitis: Secondary | ICD-10-CM

## 2018-05-27 ENCOUNTER — Ambulatory Visit (INDEPENDENT_AMBULATORY_CARE_PROVIDER_SITE_OTHER): Payer: BLUE CROSS/BLUE SHIELD | Admitting: *Deleted

## 2018-05-27 DIAGNOSIS — J3089 Other allergic rhinitis: Secondary | ICD-10-CM

## 2018-07-12 ENCOUNTER — Ambulatory Visit (INDEPENDENT_AMBULATORY_CARE_PROVIDER_SITE_OTHER): Payer: BLUE CROSS/BLUE SHIELD | Admitting: *Deleted

## 2018-07-12 DIAGNOSIS — J3089 Other allergic rhinitis: Secondary | ICD-10-CM

## 2018-07-19 ENCOUNTER — Ambulatory Visit (INDEPENDENT_AMBULATORY_CARE_PROVIDER_SITE_OTHER): Payer: BLUE CROSS/BLUE SHIELD | Admitting: *Deleted

## 2018-07-19 DIAGNOSIS — J3089 Other allergic rhinitis: Secondary | ICD-10-CM | POA: Diagnosis not present

## 2018-07-23 ENCOUNTER — Ambulatory Visit (INDEPENDENT_AMBULATORY_CARE_PROVIDER_SITE_OTHER): Payer: BLUE CROSS/BLUE SHIELD | Admitting: *Deleted

## 2018-07-23 DIAGNOSIS — J3089 Other allergic rhinitis: Secondary | ICD-10-CM | POA: Diagnosis not present

## 2018-07-26 ENCOUNTER — Ambulatory Visit (INDEPENDENT_AMBULATORY_CARE_PROVIDER_SITE_OTHER): Payer: BLUE CROSS/BLUE SHIELD | Admitting: Allergy and Immunology

## 2018-07-26 ENCOUNTER — Encounter: Payer: Self-pay | Admitting: Allergy and Immunology

## 2018-07-26 VITALS — BP 144/92 | HR 74 | Resp 17

## 2018-07-26 DIAGNOSIS — R51 Headache: Secondary | ICD-10-CM

## 2018-07-26 DIAGNOSIS — H6993 Unspecified Eustachian tube disorder, bilateral: Secondary | ICD-10-CM

## 2018-07-26 DIAGNOSIS — J309 Allergic rhinitis, unspecified: Secondary | ICD-10-CM | POA: Diagnosis not present

## 2018-07-26 DIAGNOSIS — K219 Gastro-esophageal reflux disease without esophagitis: Secondary | ICD-10-CM

## 2018-07-26 DIAGNOSIS — R519 Headache, unspecified: Secondary | ICD-10-CM

## 2018-07-26 DIAGNOSIS — H6983 Other specified disorders of Eustachian tube, bilateral: Secondary | ICD-10-CM | POA: Diagnosis not present

## 2018-07-26 DIAGNOSIS — J3089 Other allergic rhinitis: Secondary | ICD-10-CM | POA: Diagnosis not present

## 2018-07-26 MED ORDER — AZELASTINE HCL 0.1 % NA SOLN: NASAL | 5 refills | Status: AC
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
## 2018-07-26 NOTE — Patient Instructions (Addendum)
  1.  Continue immunotherapy and Auvi-Q  2.  Continue to treat and prevent inflammation:   A. OTC Nasacort - one spray each nostril 2 times per day  B. Azelastine - one spray each nostril 2 times per day  C. Montelukast 10mg  - one tablet one time per day  D. Prednisone 10mg  - one tablet one time per day for 10 days only  3.  Continue to treat and prevent reflux:   A. Consolidate all caffeine and chocolate consumption    B. pantoprazole 40 mg twice a day  C. ranitidine 300 mg in evening  4. If needed:     A. Nasal saline spray/wash  B. OTC Mucinex DM 2 tablets twice a day  C. OTC antihistamine - Claritin/Zyrtec/Allegra - one time per day  D. Positive airway inflation of ears  5. Return to clinic in 3 weeks or earlier if problem

## 2018-07-27 ENCOUNTER — Encounter: Payer: Self-pay | Admitting: Allergy and Immunology

## 2018-08-02 ENCOUNTER — Ambulatory Visit (INDEPENDENT_AMBULATORY_CARE_PROVIDER_SITE_OTHER): Payer: BLUE CROSS/BLUE SHIELD | Admitting: *Deleted

## 2018-08-02 DIAGNOSIS — J3089 Other allergic rhinitis: Secondary | ICD-10-CM

## 2018-08-03 DIAGNOSIS — J3081 Allergic rhinitis due to animal (cat) (dog) hair and dander: Secondary | ICD-10-CM | POA: Diagnosis not present

## 2018-08-03 NOTE — Progress Notes (Signed)
VIALS EXP 08-04-19 

## 2018-08-06 ENCOUNTER — Ambulatory Visit (INDEPENDENT_AMBULATORY_CARE_PROVIDER_SITE_OTHER): Payer: BLUE CROSS/BLUE SHIELD | Admitting: *Deleted

## 2018-08-06 DIAGNOSIS — J3089 Other allergic rhinitis: Secondary | ICD-10-CM

## 2018-08-13 HISTORY — PX: CATARACT EXTRACTION W/ INTRAOCULAR LENS IMPLANTW/ TRABECULECTOMY: SHX1312

## 2018-08-16 ENCOUNTER — Ambulatory Visit (INDEPENDENT_AMBULATORY_CARE_PROVIDER_SITE_OTHER): Payer: BLUE CROSS/BLUE SHIELD | Admitting: Allergy and Immunology

## 2018-08-16 ENCOUNTER — Encounter: Payer: Self-pay | Admitting: Allergy and Immunology

## 2018-08-16 VITALS — BP 120/78 | HR 78 | Temp 98.3°F | Resp 16

## 2018-08-16 DIAGNOSIS — K219 Gastro-esophageal reflux disease without esophagitis: Secondary | ICD-10-CM | POA: Diagnosis not present

## 2018-08-16 DIAGNOSIS — J3089 Other allergic rhinitis: Secondary | ICD-10-CM | POA: Diagnosis not present

## 2018-08-16 DIAGNOSIS — R519 Headache, unspecified: Secondary | ICD-10-CM

## 2018-08-16 DIAGNOSIS — H6983 Other specified disorders of Eustachian tube, bilateral: Secondary | ICD-10-CM | POA: Diagnosis not present

## 2018-08-16 DIAGNOSIS — R51 Headache: Secondary | ICD-10-CM

## 2018-08-16 DIAGNOSIS — H6993 Unspecified Eustachian tube disorder, bilateral: Secondary | ICD-10-CM

## 2018-08-16 DIAGNOSIS — J309 Allergic rhinitis, unspecified: Secondary | ICD-10-CM | POA: Diagnosis not present
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
## 2018-08-16 NOTE — Patient Instructions (Addendum)
  1.  Continue immunotherapy and Auvi-Q  2.  Continue to treat and prevent inflammation:   A. OTC Nasacort - one spray each nostril 2 times per day  B. Azelastine - one spray each nostril 2 times per day  C. Montelukast 10mg  - one tablet one time per day  3.  Continue to treat and prevent reflux:   A. Consolidate all caffeine and chocolate consumption    B. pantoprazole 40 mg twice a day  C. ranitidine 300 mg in evening  4. If needed:      A. Nasal saline spray/wash  B. OTC Mucinex DM 2 tablets twice a day  C. OTC antihistamine - Claritin/Zyrtec/Allegra - one time per day  D. Positive airway inflation of ears  5. Return to clinic in 6 months or earlier if problem

## 2018-08-17 ENCOUNTER — Encounter: Payer: Self-pay | Admitting: Allergy and Immunology

## 2018-08-26 ENCOUNTER — Ambulatory Visit (INDEPENDENT_AMBULATORY_CARE_PROVIDER_SITE_OTHER): Payer: BLUE CROSS/BLUE SHIELD | Admitting: *Deleted

## 2018-08-26 DIAGNOSIS — J3089 Other allergic rhinitis: Secondary | ICD-10-CM | POA: Diagnosis not present

## 2018-09-17 ENCOUNTER — Ambulatory Visit (INDEPENDENT_AMBULATORY_CARE_PROVIDER_SITE_OTHER): Payer: BLUE CROSS/BLUE SHIELD | Admitting: *Deleted

## 2018-09-17 DIAGNOSIS — J3089 Other allergic rhinitis: Secondary | ICD-10-CM | POA: Diagnosis not present

## 2018-10-15 ENCOUNTER — Ambulatory Visit (INDEPENDENT_AMBULATORY_CARE_PROVIDER_SITE_OTHER): Payer: BLUE CROSS/BLUE SHIELD

## 2018-10-15 DIAGNOSIS — J3089 Other allergic rhinitis: Secondary | ICD-10-CM | POA: Diagnosis not present

## 2018-11-30 ENCOUNTER — Other Ambulatory Visit: Payer: Self-pay | Admitting: Allergy and Immunology

## 2019-08-17 ENCOUNTER — Ambulatory Visit (INDEPENDENT_AMBULATORY_CARE_PROVIDER_SITE_OTHER): Payer: Self-pay | Admitting: Allergy and Immunology

## 2019-08-17 ENCOUNTER — Other Ambulatory Visit: Payer: Self-pay

## 2019-08-17 ENCOUNTER — Encounter: Payer: Self-pay | Admitting: Allergy and Immunology

## 2019-08-17 VITALS — BP 122/70 | HR 64 | Resp 22

## 2019-08-17 DIAGNOSIS — K219 Gastro-esophageal reflux disease without esophagitis: Secondary | ICD-10-CM

## 2019-08-17 DIAGNOSIS — J3089 Other allergic rhinitis: Secondary | ICD-10-CM

## 2019-08-17 DIAGNOSIS — R519 Headache, unspecified: Secondary | ICD-10-CM

## 2019-08-17 DIAGNOSIS — J310 Chronic rhinitis: Secondary | ICD-10-CM

## 2019-08-17 DIAGNOSIS — T485X5A Adverse effect of other anti-common-cold drugs, initial encounter: Secondary | ICD-10-CM

## 2019-08-17 MED ORDER — METHYLPREDNISOLONE ACETATE 80 MG/ML IJ SUSP
80.0000 mg | Freq: Once | INTRAMUSCULAR | Status: AC
  Administered 2019-08-17: 80 mg via INTRAMUSCULAR

## 2019-08-17 NOTE — Patient Instructions (Addendum)
  1.  Continue to treat and prevent inflammation:   A. OTC Nasacort + Azelastine - one spray each nostril 2 times per day  B. Montelukast 10mg  - one tablet one time per day  C. Depo-Medrol 80 IM delivered in clinic today  2.  Continue to treat and prevent reflux:   A. Consolidate all caffeine and chocolate consumption    B. pantoprazole 40 mg twice a day  4. If needed:      A. Nasal saline spray/wash  B. OTC Mucinex DM 2 tablets twice a day  C. OTC antihistamine - Claritin/Zyrtec/Allegra - one time per day  5.  Taper off Afrin  6.  Obtain Covid vaccine when available  7.  Consider restarting immunotherapy  8. Return to clinic in 12 months or earlier if problem
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
## 2019-08-18 ENCOUNTER — Encounter: Payer: Self-pay | Admitting: Allergy and Immunology

## 2019-08-24 DIAGNOSIS — J3081 Allergic rhinitis due to animal (cat) (dog) hair and dander: Secondary | ICD-10-CM

## 2019-08-24 NOTE — Progress Notes (Signed)
VIALS EXP 08-23-20 

## 2019-08-25 DIAGNOSIS — J3089 Other allergic rhinitis: Secondary | ICD-10-CM

## 2019-08-29 ENCOUNTER — Ambulatory Visit: Payer: Self-pay

## 2019-08-29 ENCOUNTER — Ambulatory Visit (INDEPENDENT_AMBULATORY_CARE_PROVIDER_SITE_OTHER): Payer: Self-pay

## 2019-08-29 DIAGNOSIS — J309 Allergic rhinitis, unspecified: Secondary | ICD-10-CM

## 2019-08-29 NOTE — Progress Notes (Signed)
Immunotherapy   Patient Details  Name: Jose Mason MRN: [id number] Date of Birth: December 05, 1960  08/29/2019  [name] restarted immunotherapy. He received 0.05 mold and cat/dog out of the blue vials. Patient waited 30 minutes and had no reactions. Following schedule: B Frequency: 1-2 TIMES WEEKLY Epi-Pen: YES Consent signed and patient instructions given.   Donzetta Starch 08/29/2019, 2:06 PM

## 2019-09-01 ENCOUNTER — Ambulatory Visit: Payer: Self-pay

## 2019-09-12 ENCOUNTER — Ambulatory Visit (INDEPENDENT_AMBULATORY_CARE_PROVIDER_SITE_OTHER): Payer: Self-pay | Admitting: *Deleted

## 2019-09-12 DIAGNOSIS — J309 Allergic rhinitis, unspecified: Secondary | ICD-10-CM

## 2019-10-03 ENCOUNTER — Ambulatory Visit (INDEPENDENT_AMBULATORY_CARE_PROVIDER_SITE_OTHER): Payer: Self-pay | Admitting: *Deleted

## 2019-10-03 DIAGNOSIS — J309 Allergic rhinitis, unspecified: Secondary | ICD-10-CM

## 2019-10-12 ENCOUNTER — Ambulatory Visit (INDEPENDENT_AMBULATORY_CARE_PROVIDER_SITE_OTHER): Payer: Self-pay | Admitting: *Deleted

## 2019-10-12 DIAGNOSIS — J309 Allergic rhinitis, unspecified: Secondary | ICD-10-CM

## 2019-10-21 ENCOUNTER — Ambulatory Visit (INDEPENDENT_AMBULATORY_CARE_PROVIDER_SITE_OTHER): Payer: Self-pay | Admitting: *Deleted

## 2019-10-21 DIAGNOSIS — J309 Allergic rhinitis, unspecified: Secondary | ICD-10-CM

## 2019-12-23 ENCOUNTER — Ambulatory Visit (INDEPENDENT_AMBULATORY_CARE_PROVIDER_SITE_OTHER): Payer: Self-pay

## 2019-12-23 DIAGNOSIS — J309 Allergic rhinitis, unspecified: Secondary | ICD-10-CM

## 2020-01-06 ENCOUNTER — Ambulatory Visit (INDEPENDENT_AMBULATORY_CARE_PROVIDER_SITE_OTHER): Payer: Self-pay

## 2020-01-06 DIAGNOSIS — J309 Allergic rhinitis, unspecified: Secondary | ICD-10-CM

## 2020-01-16 ENCOUNTER — Ambulatory Visit (INDEPENDENT_AMBULATORY_CARE_PROVIDER_SITE_OTHER): Payer: Self-pay | Admitting: *Deleted

## 2020-01-16 DIAGNOSIS — J309 Allergic rhinitis, unspecified: Secondary | ICD-10-CM

## 2020-01-30 ENCOUNTER — Ambulatory Visit (INDEPENDENT_AMBULATORY_CARE_PROVIDER_SITE_OTHER): Payer: Self-pay | Admitting: *Deleted

## 2020-01-30 DIAGNOSIS — J309 Allergic rhinitis, unspecified: Secondary | ICD-10-CM

## 2020-02-06 ENCOUNTER — Ambulatory Visit (INDEPENDENT_AMBULATORY_CARE_PROVIDER_SITE_OTHER): Payer: Self-pay | Admitting: *Deleted

## 2020-02-06 DIAGNOSIS — J309 Allergic rhinitis, unspecified: Secondary | ICD-10-CM

## 2020-02-14 ENCOUNTER — Ambulatory Visit (INDEPENDENT_AMBULATORY_CARE_PROVIDER_SITE_OTHER): Payer: Self-pay | Admitting: *Deleted

## 2020-02-14 DIAGNOSIS — J309 Allergic rhinitis, unspecified: Secondary | ICD-10-CM

## 2020-03-02 ENCOUNTER — Ambulatory Visit (INDEPENDENT_AMBULATORY_CARE_PROVIDER_SITE_OTHER): Payer: Self-pay | Admitting: *Deleted

## 2020-03-02 DIAGNOSIS — J309 Allergic rhinitis, unspecified: Secondary | ICD-10-CM

## 2020-03-19 ENCOUNTER — Ambulatory Visit (INDEPENDENT_AMBULATORY_CARE_PROVIDER_SITE_OTHER): Payer: Self-pay | Admitting: *Deleted

## 2020-03-19 DIAGNOSIS — J309 Allergic rhinitis, unspecified: Secondary | ICD-10-CM

## 2020-03-27 ENCOUNTER — Ambulatory Visit (INDEPENDENT_AMBULATORY_CARE_PROVIDER_SITE_OTHER): Payer: Self-pay | Admitting: *Deleted

## 2020-03-27 DIAGNOSIS — J309 Allergic rhinitis, unspecified: Secondary | ICD-10-CM

## 2020-04-09 ENCOUNTER — Ambulatory Visit (INDEPENDENT_AMBULATORY_CARE_PROVIDER_SITE_OTHER): Payer: Self-pay

## 2020-04-09 DIAGNOSIS — J309 Allergic rhinitis, unspecified: Secondary | ICD-10-CM

## 2020-05-21 ENCOUNTER — Ambulatory Visit (INDEPENDENT_AMBULATORY_CARE_PROVIDER_SITE_OTHER): Payer: Self-pay | Admitting: *Deleted

## 2020-05-21 DIAGNOSIS — J309 Allergic rhinitis, unspecified: Secondary | ICD-10-CM

## 2020-06-15 ENCOUNTER — Ambulatory Visit (INDEPENDENT_AMBULATORY_CARE_PROVIDER_SITE_OTHER): Payer: Self-pay | Admitting: *Deleted

## 2020-06-15 DIAGNOSIS — J309 Allergic rhinitis, unspecified: Secondary | ICD-10-CM

## 2020-06-25 ENCOUNTER — Ambulatory Visit (INDEPENDENT_AMBULATORY_CARE_PROVIDER_SITE_OTHER): Payer: Self-pay | Admitting: *Deleted

## 2020-06-25 DIAGNOSIS — J309 Allergic rhinitis, unspecified: Secondary | ICD-10-CM

## 2020-07-27 ENCOUNTER — Ambulatory Visit (INDEPENDENT_AMBULATORY_CARE_PROVIDER_SITE_OTHER): Payer: Self-pay

## 2020-07-27 DIAGNOSIS — J309 Allergic rhinitis, unspecified: Secondary | ICD-10-CM

## 2020-08-15 ENCOUNTER — Ambulatory Visit (INDEPENDENT_AMBULATORY_CARE_PROVIDER_SITE_OTHER): Payer: Self-pay | Admitting: *Deleted

## 2020-08-15 DIAGNOSIS — J309 Allergic rhinitis, unspecified: Secondary | ICD-10-CM

## 2020-08-21 DIAGNOSIS — J3081 Allergic rhinitis due to animal (cat) (dog) hair and dander: Secondary | ICD-10-CM

## 2020-08-21 NOTE — Progress Notes (Signed)
VIALS EXP 08-21-21 °

## 2020-08-22 DIAGNOSIS — J3089 Other allergic rhinitis: Secondary | ICD-10-CM

## 2020-08-29 ENCOUNTER — Encounter: Payer: Self-pay | Admitting: Allergy and Immunology

## 2020-09-03 ENCOUNTER — Ambulatory Visit (INDEPENDENT_AMBULATORY_CARE_PROVIDER_SITE_OTHER): Payer: Self-pay

## 2020-09-03 DIAGNOSIS — J309 Allergic rhinitis, unspecified: Secondary | ICD-10-CM

## 2020-09-26 ENCOUNTER — Ambulatory Visit (INDEPENDENT_AMBULATORY_CARE_PROVIDER_SITE_OTHER): Payer: Self-pay | Admitting: *Deleted

## 2020-09-26 DIAGNOSIS — J309 Allergic rhinitis, unspecified: Secondary | ICD-10-CM

## 2020-11-05 ENCOUNTER — Ambulatory Visit (INDEPENDENT_AMBULATORY_CARE_PROVIDER_SITE_OTHER): Payer: Self-pay

## 2020-11-05 DIAGNOSIS — J309 Allergic rhinitis, unspecified: Secondary | ICD-10-CM

## 2020-11-16 ENCOUNTER — Ambulatory Visit (INDEPENDENT_AMBULATORY_CARE_PROVIDER_SITE_OTHER): Payer: Self-pay

## 2020-11-16 DIAGNOSIS — J309 Allergic rhinitis, unspecified: Secondary | ICD-10-CM

## 2020-11-23 ENCOUNTER — Ambulatory Visit (INDEPENDENT_AMBULATORY_CARE_PROVIDER_SITE_OTHER): Payer: Self-pay | Admitting: *Deleted

## 2020-11-23 DIAGNOSIS — J309 Allergic rhinitis, unspecified: Secondary | ICD-10-CM

## 2020-12-05 ENCOUNTER — Ambulatory Visit (INDEPENDENT_AMBULATORY_CARE_PROVIDER_SITE_OTHER): Payer: Self-pay

## 2020-12-05 DIAGNOSIS — J309 Allergic rhinitis, unspecified: Secondary | ICD-10-CM

## 2020-12-20 ENCOUNTER — Ambulatory Visit (INDEPENDENT_AMBULATORY_CARE_PROVIDER_SITE_OTHER): Payer: Self-pay | Admitting: *Deleted

## 2020-12-20 DIAGNOSIS — J309 Allergic rhinitis, unspecified: Secondary | ICD-10-CM

## 2020-12-31 ENCOUNTER — Ambulatory Visit (INDEPENDENT_AMBULATORY_CARE_PROVIDER_SITE_OTHER): Payer: Self-pay | Admitting: *Deleted

## 2020-12-31 DIAGNOSIS — J309 Allergic rhinitis, unspecified: Secondary | ICD-10-CM

## 2021-01-14 ENCOUNTER — Ambulatory Visit (INDEPENDENT_AMBULATORY_CARE_PROVIDER_SITE_OTHER): Payer: Self-pay | Admitting: *Deleted

## 2021-01-14 DIAGNOSIS — J309 Allergic rhinitis, unspecified: Secondary | ICD-10-CM

## 2021-02-04 ENCOUNTER — Ambulatory Visit (INDEPENDENT_AMBULATORY_CARE_PROVIDER_SITE_OTHER): Payer: Self-pay | Admitting: *Deleted

## 2021-02-04 DIAGNOSIS — J309 Allergic rhinitis, unspecified: Secondary | ICD-10-CM

## 2021-02-18 ENCOUNTER — Ambulatory Visit (INDEPENDENT_AMBULATORY_CARE_PROVIDER_SITE_OTHER): Payer: Self-pay | Admitting: *Deleted

## 2021-02-18 DIAGNOSIS — J309 Allergic rhinitis, unspecified: Secondary | ICD-10-CM

## 2021-02-25 ENCOUNTER — Ambulatory Visit (INDEPENDENT_AMBULATORY_CARE_PROVIDER_SITE_OTHER): Payer: Self-pay | Admitting: *Deleted

## 2021-02-25 DIAGNOSIS — J309 Allergic rhinitis, unspecified: Secondary | ICD-10-CM

## 2021-03-12 ENCOUNTER — Ambulatory Visit (INDEPENDENT_AMBULATORY_CARE_PROVIDER_SITE_OTHER): Payer: Self-pay | Admitting: *Deleted

## 2021-03-12 DIAGNOSIS — J309 Allergic rhinitis, unspecified: Secondary | ICD-10-CM

## 2021-03-28 ENCOUNTER — Ambulatory Visit (INDEPENDENT_AMBULATORY_CARE_PROVIDER_SITE_OTHER): Payer: Self-pay | Admitting: *Deleted

## 2021-03-28 DIAGNOSIS — J309 Allergic rhinitis, unspecified: Secondary | ICD-10-CM

## 2021-04-04 ENCOUNTER — Ambulatory Visit (INDEPENDENT_AMBULATORY_CARE_PROVIDER_SITE_OTHER): Payer: Self-pay | Admitting: *Deleted

## 2021-04-04 DIAGNOSIS — J309 Allergic rhinitis, unspecified: Secondary | ICD-10-CM

## 2021-04-10 ENCOUNTER — Ambulatory Visit (INDEPENDENT_AMBULATORY_CARE_PROVIDER_SITE_OTHER): Payer: Self-pay | Admitting: *Deleted

## 2021-04-10 DIAGNOSIS — J309 Allergic rhinitis, unspecified: Secondary | ICD-10-CM

## 2021-04-17 ENCOUNTER — Ambulatory Visit (INDEPENDENT_AMBULATORY_CARE_PROVIDER_SITE_OTHER): Payer: Self-pay | Admitting: *Deleted

## 2021-04-17 DIAGNOSIS — J309 Allergic rhinitis, unspecified: Secondary | ICD-10-CM

## 2021-05-01 ENCOUNTER — Ambulatory Visit (INDEPENDENT_AMBULATORY_CARE_PROVIDER_SITE_OTHER): Payer: Self-pay | Admitting: *Deleted

## 2021-05-01 DIAGNOSIS — J309 Allergic rhinitis, unspecified: Secondary | ICD-10-CM

## 2021-05-16 ENCOUNTER — Ambulatory Visit (INDEPENDENT_AMBULATORY_CARE_PROVIDER_SITE_OTHER): Payer: Self-pay

## 2021-05-16 DIAGNOSIS — J309 Allergic rhinitis, unspecified: Secondary | ICD-10-CM

## 2021-05-20 ENCOUNTER — Ambulatory Visit (INDEPENDENT_AMBULATORY_CARE_PROVIDER_SITE_OTHER): Payer: Self-pay | Admitting: *Deleted

## 2021-05-20 DIAGNOSIS — J309 Allergic rhinitis, unspecified: Secondary | ICD-10-CM

## 2021-06-05 ENCOUNTER — Ambulatory Visit (INDEPENDENT_AMBULATORY_CARE_PROVIDER_SITE_OTHER): Payer: Self-pay | Admitting: *Deleted

## 2021-06-05 DIAGNOSIS — J309 Allergic rhinitis, unspecified: Secondary | ICD-10-CM

## 2021-06-13 ENCOUNTER — Ambulatory Visit (INDEPENDENT_AMBULATORY_CARE_PROVIDER_SITE_OTHER): Payer: Self-pay | Admitting: *Deleted

## 2021-06-13 DIAGNOSIS — J309 Allergic rhinitis, unspecified: Secondary | ICD-10-CM

## 2021-06-20 ENCOUNTER — Ambulatory Visit (INDEPENDENT_AMBULATORY_CARE_PROVIDER_SITE_OTHER): Payer: Self-pay

## 2021-06-20 DIAGNOSIS — J309 Allergic rhinitis, unspecified: Secondary | ICD-10-CM

## 2021-06-27 ENCOUNTER — Ambulatory Visit (INDEPENDENT_AMBULATORY_CARE_PROVIDER_SITE_OTHER): Payer: Self-pay | Admitting: *Deleted

## 2021-06-27 DIAGNOSIS — J309 Allergic rhinitis, unspecified: Secondary | ICD-10-CM

## 2021-07-11 ENCOUNTER — Ambulatory Visit (INDEPENDENT_AMBULATORY_CARE_PROVIDER_SITE_OTHER): Payer: Self-pay | Admitting: *Deleted

## 2021-07-11 DIAGNOSIS — J309 Allergic rhinitis, unspecified: Secondary | ICD-10-CM

## 2021-07-18 ENCOUNTER — Ambulatory Visit (INDEPENDENT_AMBULATORY_CARE_PROVIDER_SITE_OTHER): Payer: Self-pay | Admitting: *Deleted

## 2021-07-18 DIAGNOSIS — J309 Allergic rhinitis, unspecified: Secondary | ICD-10-CM

## 2021-07-25 ENCOUNTER — Ambulatory Visit (INDEPENDENT_AMBULATORY_CARE_PROVIDER_SITE_OTHER): Payer: Self-pay | Admitting: *Deleted

## 2021-07-25 DIAGNOSIS — J309 Allergic rhinitis, unspecified: Secondary | ICD-10-CM

## 2021-08-01 ENCOUNTER — Ambulatory Visit (INDEPENDENT_AMBULATORY_CARE_PROVIDER_SITE_OTHER): Payer: Self-pay | Admitting: *Deleted

## 2021-08-01 DIAGNOSIS — J309 Allergic rhinitis, unspecified: Secondary | ICD-10-CM

## 2021-08-06 ENCOUNTER — Ambulatory Visit (INDEPENDENT_AMBULATORY_CARE_PROVIDER_SITE_OTHER): Payer: Self-pay

## 2021-08-06 DIAGNOSIS — J309 Allergic rhinitis, unspecified: Secondary | ICD-10-CM

## 2021-08-15 ENCOUNTER — Ambulatory Visit (INDEPENDENT_AMBULATORY_CARE_PROVIDER_SITE_OTHER): Payer: Self-pay | Admitting: *Deleted

## 2021-08-15 DIAGNOSIS — J309 Allergic rhinitis, unspecified: Secondary | ICD-10-CM

## 2021-08-15 NOTE — Progress Notes (Signed)
VIALS MADE. EXP 08-15-22 

## 2021-08-16 DIAGNOSIS — J3081 Allergic rhinitis due to animal (cat) (dog) hair and dander: Secondary | ICD-10-CM

## 2021-08-22 ENCOUNTER — Ambulatory Visit (INDEPENDENT_AMBULATORY_CARE_PROVIDER_SITE_OTHER): Payer: Self-pay

## 2021-08-22 DIAGNOSIS — J309 Allergic rhinitis, unspecified: Secondary | ICD-10-CM

## 2021-09-02 ENCOUNTER — Ambulatory Visit (INDEPENDENT_AMBULATORY_CARE_PROVIDER_SITE_OTHER): Payer: Self-pay

## 2021-09-02 DIAGNOSIS — J309 Allergic rhinitis, unspecified: Secondary | ICD-10-CM

## 2021-09-09 ENCOUNTER — Ambulatory Visit (INDEPENDENT_AMBULATORY_CARE_PROVIDER_SITE_OTHER): Payer: Self-pay | Admitting: *Deleted

## 2021-09-09 DIAGNOSIS — J309 Allergic rhinitis, unspecified: Secondary | ICD-10-CM

## 2021-09-17 ENCOUNTER — Ambulatory Visit (INDEPENDENT_AMBULATORY_CARE_PROVIDER_SITE_OTHER): Payer: Self-pay | Admitting: *Deleted

## 2021-09-17 DIAGNOSIS — J309 Allergic rhinitis, unspecified: Secondary | ICD-10-CM

## 2021-09-23 ENCOUNTER — Ambulatory Visit: Payer: Self-pay | Admitting: Allergy and Immunology

## 2021-09-24 ENCOUNTER — Ambulatory Visit (INDEPENDENT_AMBULATORY_CARE_PROVIDER_SITE_OTHER): Payer: Self-pay

## 2021-09-24 DIAGNOSIS — J309 Allergic rhinitis, unspecified: Secondary | ICD-10-CM

## 2021-10-10 ENCOUNTER — Ambulatory Visit (INDEPENDENT_AMBULATORY_CARE_PROVIDER_SITE_OTHER): Payer: Self-pay

## 2021-10-10 DIAGNOSIS — J309 Allergic rhinitis, unspecified: Secondary | ICD-10-CM

## 2021-10-14 ENCOUNTER — Ambulatory Visit: Payer: Self-pay | Admitting: Allergy and Immunology

## 2021-10-24 ENCOUNTER — Ambulatory Visit (INDEPENDENT_AMBULATORY_CARE_PROVIDER_SITE_OTHER): Payer: Self-pay | Admitting: *Deleted

## 2021-10-24 DIAGNOSIS — J309 Allergic rhinitis, unspecified: Secondary | ICD-10-CM

## 2021-10-30 ENCOUNTER — Other Ambulatory Visit: Payer: Self-pay

## 2021-10-30 ENCOUNTER — Ambulatory Visit (INDEPENDENT_AMBULATORY_CARE_PROVIDER_SITE_OTHER): Payer: Self-pay | Admitting: Allergy and Immunology

## 2021-10-30 ENCOUNTER — Encounter: Payer: Self-pay | Admitting: Allergy and Immunology

## 2021-10-30 VITALS — BP 124/72 | HR 70 | Resp 16 | Ht 69.0 in | Wt 226.0 lb

## 2021-10-30 DIAGNOSIS — J310 Chronic rhinitis: Secondary | ICD-10-CM

## 2021-10-30 DIAGNOSIS — J0140 Acute pansinusitis, unspecified: Secondary | ICD-10-CM

## 2021-10-30 DIAGNOSIS — J309 Allergic rhinitis, unspecified: Secondary | ICD-10-CM

## 2021-10-30 MED ORDER — METHYLPREDNISOLONE ACETATE 80 MG/ML IJ SUSP
80.0000 mg | Freq: Once | INTRAMUSCULAR | Status: AC
  Administered 2021-10-30: 80 mg via INTRAMUSCULAR

## 2021-10-30 MED ORDER — RYALTRIS 665-25 MCG/ACT NA SUSP: 2.0000 | Freq: Two times a day (BID) | NASAL | 5 refills | Status: AC

## 2021-10-30 MED ORDER — AMOXICILLIN-POT CLAVULANATE 875-125 MG PO TABS: 1.0000 | ORAL_TABLET | Freq: Two times a day (BID) | ORAL | 0 refills | Status: AC
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
## 2021-10-30 NOTE — Patient Instructions (Addendum)
°  1.  Depo-Medrol 80 IM delivered in clinic today  2.  Augmentin 875-1 tablet twice a day for 10 days  3. Ryaltris - 2 sprays each nostril 2 times per day (specialty pharmacy)  4. Montelukast 10 mg - 1 tablet 1 time per day  5. Pantoprazole 40 mg - 1 tablet 2 times per day  6. Minimize use of Afrin. Use antihistamine and nasal saline if needed  7. Continue immunotherapy  8. Return to clinic in 1 year or earlier if problem.

## 2021-10-31 ENCOUNTER — Encounter: Payer: Self-pay | Admitting: Allergy and Immunology

## 2021-11-07 ENCOUNTER — Ambulatory Visit (INDEPENDENT_AMBULATORY_CARE_PROVIDER_SITE_OTHER): Payer: Self-pay | Admitting: *Deleted

## 2021-11-07 DIAGNOSIS — J309 Allergic rhinitis, unspecified: Secondary | ICD-10-CM

## 2021-11-14 ENCOUNTER — Ambulatory Visit (INDEPENDENT_AMBULATORY_CARE_PROVIDER_SITE_OTHER): Payer: Self-pay | Admitting: *Deleted

## 2021-11-14 DIAGNOSIS — J309 Allergic rhinitis, unspecified: Secondary | ICD-10-CM

## 2021-11-19 ENCOUNTER — Telehealth: Payer: Self-pay | Admitting: *Deleted

## 2021-11-19 ENCOUNTER — Other Ambulatory Visit: Payer: Self-pay | Admitting: *Deleted

## 2021-11-19 ENCOUNTER — Ambulatory Visit (INDEPENDENT_AMBULATORY_CARE_PROVIDER_SITE_OTHER): Payer: Self-pay | Admitting: *Deleted

## 2021-11-19 DIAGNOSIS — J309 Allergic rhinitis, unspecified: Secondary | ICD-10-CM

## 2021-11-19 MED ORDER — AMOXICILLIN-POT CLAVULANATE 875-125 MG PO TABS: ORAL_TABLET | ORAL | 0 refills | Status: AC

## 2021-11-19 NOTE — Telephone Encounter (Signed)
Jose Mason states that his congestion/sinus pressure/headache/green-yellow nasal discharge has returned. He said it got better for about a week but all symptoms have returned. He would like another round of Augmentin. Please advise.

## 2021-11-19 NOTE — Telephone Encounter (Signed)
RX sent and Jose Mason informed.

## 2021-12-10 ENCOUNTER — Ambulatory Visit (INDEPENDENT_AMBULATORY_CARE_PROVIDER_SITE_OTHER): Payer: Self-pay | Admitting: *Deleted

## 2021-12-10 DIAGNOSIS — J309 Allergic rhinitis, unspecified: Secondary | ICD-10-CM

## 2021-12-19 ENCOUNTER — Ambulatory Visit (INDEPENDENT_AMBULATORY_CARE_PROVIDER_SITE_OTHER): Payer: Self-pay

## 2021-12-19 DIAGNOSIS — J309 Allergic rhinitis, unspecified: Secondary | ICD-10-CM

## 2021-12-26 ENCOUNTER — Ambulatory Visit (INDEPENDENT_AMBULATORY_CARE_PROVIDER_SITE_OTHER): Payer: Self-pay | Admitting: *Deleted

## 2021-12-26 DIAGNOSIS — J309 Allergic rhinitis, unspecified: Secondary | ICD-10-CM

## 2022-01-08 ENCOUNTER — Ambulatory Visit (INDEPENDENT_AMBULATORY_CARE_PROVIDER_SITE_OTHER): Payer: Self-pay | Admitting: *Deleted

## 2022-01-08 DIAGNOSIS — J309 Allergic rhinitis, unspecified: Secondary | ICD-10-CM

## 2022-01-15 ENCOUNTER — Ambulatory Visit (INDEPENDENT_AMBULATORY_CARE_PROVIDER_SITE_OTHER): Payer: Self-pay | Admitting: *Deleted

## 2022-01-15 DIAGNOSIS — J309 Allergic rhinitis, unspecified: Secondary | ICD-10-CM

## 2022-01-23 ENCOUNTER — Ambulatory Visit (INDEPENDENT_AMBULATORY_CARE_PROVIDER_SITE_OTHER): Payer: Self-pay | Admitting: *Deleted

## 2022-01-23 DIAGNOSIS — J309 Allergic rhinitis, unspecified: Secondary | ICD-10-CM

## 2022-01-30 ENCOUNTER — Ambulatory Visit (INDEPENDENT_AMBULATORY_CARE_PROVIDER_SITE_OTHER): Payer: Self-pay | Admitting: *Deleted

## 2022-01-30 DIAGNOSIS — J309 Allergic rhinitis, unspecified: Secondary | ICD-10-CM

## 2022-02-12 DIAGNOSIS — J3081 Allergic rhinitis due to animal (cat) (dog) hair and dander: Secondary | ICD-10-CM

## 2022-02-12 NOTE — Progress Notes (Signed)
VIALS EXP 02-13-23 ?

## 2022-02-13 ENCOUNTER — Ambulatory Visit (INDEPENDENT_AMBULATORY_CARE_PROVIDER_SITE_OTHER): Payer: Self-pay | Admitting: *Deleted

## 2022-02-13 DIAGNOSIS — J309 Allergic rhinitis, unspecified: Secondary | ICD-10-CM

## 2022-02-20 ENCOUNTER — Ambulatory Visit (INDEPENDENT_AMBULATORY_CARE_PROVIDER_SITE_OTHER): Payer: Self-pay | Admitting: *Deleted

## 2022-02-20 DIAGNOSIS — J309 Allergic rhinitis, unspecified: Secondary | ICD-10-CM

## 2022-02-27 ENCOUNTER — Ambulatory Visit (INDEPENDENT_AMBULATORY_CARE_PROVIDER_SITE_OTHER): Payer: Self-pay | Admitting: *Deleted

## 2022-02-27 DIAGNOSIS — J309 Allergic rhinitis, unspecified: Secondary | ICD-10-CM
